# Patient Record
Sex: Female | Born: 1950 | Race: Black or African American | Hispanic: No | Marital: Single | Smoking: Current every day smoker
Health system: Southern US, Community
[De-identification: ages and names within clinical notes are randomized; demographics above are authoritative.]

## PROBLEM LIST (undated history)

## (undated) DIAGNOSIS — M810 Age-related osteoporosis without current pathological fracture: Secondary | ICD-10-CM

## (undated) HISTORY — PX: ABDOMINAL HYSTERECTOMY: SHX81

---

## 2007-04-16 ENCOUNTER — Ambulatory Visit: Payer: Self-pay | Admitting: Family Medicine

## 2008-01-27 ENCOUNTER — Emergency Department: Payer: Self-pay | Admitting: Emergency Medicine

## 2008-01-27 ENCOUNTER — Other Ambulatory Visit: Payer: Self-pay

## 2011-01-22 ENCOUNTER — Emergency Department: Payer: Self-pay | Admitting: Emergency Medicine

## 2015-10-25 ENCOUNTER — Encounter: Payer: Self-pay | Admitting: Emergency Medicine

## 2015-10-25 ENCOUNTER — Other Ambulatory Visit: Payer: Self-pay

## 2015-10-25 ENCOUNTER — Emergency Department: Payer: Commercial Managed Care - HMO

## 2015-10-25 ENCOUNTER — Emergency Department
Admission: EM | Admit: 2015-10-25 | Discharge: 2015-10-25 | Disposition: A | Discharge status: Home / Self Care | Payer: Commercial Managed Care - HMO | Attending: Emergency Medicine | Admitting: Emergency Medicine

## 2015-10-25 DIAGNOSIS — R197 Diarrhea, unspecified: Secondary | ICD-10-CM | POA: Diagnosis not present

## 2015-10-25 DIAGNOSIS — N3000 Acute cystitis without hematuria: Secondary | ICD-10-CM | POA: Diagnosis not present

## 2015-10-25 DIAGNOSIS — F17210 Nicotine dependence, cigarettes, uncomplicated: Secondary | ICD-10-CM | POA: Insufficient documentation

## 2015-10-25 DIAGNOSIS — R1110 Vomiting, unspecified: Secondary | ICD-10-CM

## 2015-10-25 DIAGNOSIS — R52 Pain, unspecified: Secondary | ICD-10-CM

## 2015-10-25 DIAGNOSIS — R1013 Epigastric pain: Secondary | ICD-10-CM | POA: Diagnosis present

## 2015-10-25 DIAGNOSIS — Z79899 Other long term (current) drug therapy: Secondary | ICD-10-CM | POA: Diagnosis not present

## 2015-10-25 LAB — URINALYSIS COMPLETE WITH MICROSCOPIC (ARMC ONLY)
BACTERIA UA: NONE SEEN
Bilirubin Urine: NEGATIVE
Glucose, UA: NEGATIVE mg/dL
Ketones, ur: NEGATIVE mg/dL
Nitrite: NEGATIVE
PROTEIN: NEGATIVE mg/dL
Specific Gravity, Urine: 1.019 (ref 1.005–1.030)
pH: 5 (ref 5.0–8.0)

## 2015-10-25 LAB — COMPREHENSIVE METABOLIC PANEL
ALBUMIN: 4.2 g/dL (ref 3.5–5.0)
ALK PHOS: 57 U/L (ref 38–126)
ALT: 17 U/L (ref 14–54)
ANION GAP: 7 (ref 5–15)
AST: 22 U/L (ref 15–41)
BUN: 6 mg/dL (ref 6–20)
CALCIUM: 10 mg/dL (ref 8.9–10.3)
CO2: 25 mmol/L (ref 22–32)
Chloride: 105 mmol/L (ref 101–111)
Creatinine, Ser: 0.64 mg/dL (ref 0.44–1.00)
GFR calc Af Amer: >60 mL/min (ref >60)
GFR calc non Af Amer: >60 mL/min (ref >60)
GLUCOSE: 89 mg/dL (ref 65–99)
POTASSIUM: 3.8 mmol/L (ref 3.5–5.1)
SODIUM: 137 mmol/L (ref 135–145)
Total Bilirubin: 0.5 mg/dL (ref 0.3–1.2)
Total Protein: 7 g/dL (ref 6.5–8.1)

## 2015-10-25 LAB — LIPASE, BLOOD: Lipase: 27 U/L (ref 11–51)

## 2015-10-25 LAB — CBC
HEMATOCRIT: 38.9 % (ref 35.0–47.0)
HEMOGLOBIN: 13.3 g/dL (ref 12.0–16.0)
MCH: 26.7 pg (ref 26.0–34.0)
MCHC: 34.2 g/dL (ref 32.0–36.0)
MCV: 78.1 fL — ABNORMAL LOW (ref 80.0–100.0)
Platelets: 290 10*3/uL (ref 150–440)
RBC: 4.99 MIL/uL (ref 3.80–5.20)
RDW: 12.7 % (ref 11.5–14.5)
WBC: 11.5 10*3/uL — ABNORMAL HIGH (ref 3.6–11.0)

## 2015-10-25 MED ORDER — CIPROFLOXACIN HCL 500 MG PO TABS
500.0000 mg | ORAL_TABLET | Freq: Once | ORAL | Status: AC
  Administered 2015-10-25: 500 mg via ORAL
  Filled 2015-10-25: qty 1

## 2015-10-25 MED ORDER — DIATRIZOATE MEGLUMINE & SODIUM 66-10 % PO SOLN
15.0000 mL | Freq: Once | ORAL | Status: AC
  Administered 2015-10-25: 15 mL via ORAL

## 2015-10-25 MED ORDER — ONDANSETRON HCL 4 MG/2ML IJ SOLN
4.0000 mg | Freq: Once | INTRAMUSCULAR | Status: AC
  Administered 2015-10-25: 4 mg via INTRAVENOUS

## 2015-10-25 MED ORDER — MORPHINE SULFATE (PF) 2 MG/ML IV SOLN
2.0000 mg | Freq: Once | INTRAVENOUS | Status: AC
  Administered 2015-10-25: 2 mg via INTRAVENOUS

## 2015-10-25 MED ORDER — CIPROFLOXACIN HCL 500 MG PO TABS: 500.0000 mg | ORAL_TABLET | Freq: Two times a day (BID) | ORAL | Status: AC

## 2015-10-25 MED ORDER — LOPERAMIDE HCL 2 MG PO CAPS
4.0000 mg | ORAL_CAPSULE | Freq: Once | ORAL | Status: AC
  Administered 2015-10-25: 4 mg via ORAL
  Filled 2015-10-25: qty 2

## 2015-10-25 MED ORDER — IOPAMIDOL (ISOVUE-300) INJECTION 61%
100.0000 mL | Freq: Once | INTRAVENOUS | Status: AC | PRN
  Administered 2015-10-25: 100 mL via INTRAVENOUS

## 2015-10-25 MED ORDER — ONDANSETRON HCL 4 MG/2ML IJ SOLN
INTRAMUSCULAR | Status: AC
  Administered 2015-10-25: 4 mg via INTRAVENOUS
  Filled 2015-10-25: qty 2

## 2015-10-25 MED ORDER — MORPHINE SULFATE (PF) 2 MG/ML IV SOLN
INTRAVENOUS | Status: AC
  Administered 2015-10-25: 2 mg via INTRAVENOUS
  Filled 2015-10-25: qty 1

## 2015-10-25 NOTE — ED Notes (Signed)
Pt back from Ultrasound

## 2015-10-25 NOTE — ED Notes (Signed)
Vanessa Mercado [phone] daughter

## 2015-10-25 NOTE — ED Notes (Signed)
Patient transported to Ultrasound 

## 2015-10-25 NOTE — ED Notes (Signed)
Discharge instructions reviewed with patient. Questions fielded by this RN. Patient verbalizes understanding of instructions. Patient discharged home in stable condition per Brown MD . No acute distress noted at time of discharge.   

## 2015-10-25 NOTE — Discharge Instructions (Signed)
Diarrhea °Diarrhea is frequent loose and watery bowel movements. It can cause you to feel weak and dehydrated. Dehydration can cause you to become tired and thirsty, have a dry mouth, and have decreased urination that often is dark yellow. Diarrhea is a sign of another problem, most often an infection that will not last long. In most cases, diarrhea typically lasts 2-3 days. However, it can last longer if it is a sign of something more serious. It is important to treat your diarrhea as directed by your caregiver to lessen or prevent future episodes of diarrhea. °CAUSES  °Some common causes include: °· Gastrointestinal infections caused by viruses, bacteria, or parasites. °· Food poisoning or food allergies. °· Certain medicines, such as antibiotics, chemotherapy, and laxatives. °· Artificial sweeteners and fructose. °· Digestive disorders. °HOME CARE INSTRUCTIONS °· Ensure adequate fluid intake (hydration): Have 1 cup (8 oz) of fluid for each diarrhea episode. Avoid fluids that contain simple sugars or sports drinks, fruit juices, whole milk products, and sodas. Your urine should be clear or pale yellow if you are drinking enough fluids. Hydrate with an oral rehydration solution that you can purchase at pharmacies, retail stores, and online. You can prepare an oral rehydration solution at home by mixing the following ingredients together: °·  - tsp table salt. °· ¾ tsp baking soda. °·  tsp salt substitute containing potassium chloride. °· 1  tablespoons sugar. °· 1 L (34 oz) of water. °· Certain foods and beverages may increase the speed at which food moves through the gastrointestinal (GI) tract. These foods and beverages should be avoided and include: °· Caffeinated and alcoholic beverages. °· High-fiber foods, such as raw fruits and vegetables, nuts, seeds, and whole grain breads and cereals. °· Foods and beverages sweetened with sugar alcohols, such as xylitol, sorbitol, and mannitol. °· Some foods may be well  tolerated and may help thicken stool including: °· Starchy foods, such as rice, toast, pasta, low-sugar cereal, oatmeal, grits, baked potatoes, crackers, and bagels. °· Bananas. °· Applesauce. °· Add probiotic-rich foods to help increase healthy bacteria in the GI tract, such as yogurt and fermented milk products. °· Wash your hands well after each diarrhea episode. °· Only take over-the-counter or prescription medicines as directed by your caregiver. °· Take a warm bath to relieve any burning or pain from frequent diarrhea episodes. °SEEK IMMEDIATE MEDICAL CARE IF:  °· You are unable to keep fluids down. °· You have persistent vomiting. °· You have blood in your stool, or your stools are black and tarry. °· You do not urinate in 6-8 hours, or there is only a small amount of very dark urine. °· You have abdominal pain that increases or localizes. °· You have weakness, dizziness, confusion, or light-headedness. °· You have a severe headache. °· Your diarrhea gets worse or does not get better. °· You have a fever or persistent symptoms for more than 2-3 days. °· You have a fever and your symptoms suddenly get worse. °MAKE SURE YOU:  °· Understand these instructions. °· Will watch your condition. °· Will get help right away if you are not doing well or get worse. °  °This information is not intended to replace advice given to you by your health care provider. Make sure you discuss any questions you have with your health care provider. °  °Document Released: 04/12/2002 Document Revised: 05/13/2014 Document Reviewed: 12/29/2011 °Elsevier Interactive Patient Education ©2016 [employer] ° °Urinary Tract Infection °Urinary tract infections (UTIs) can develop anywhere along   your urinary tract. Your urinary tract is your body's drainage system for removing wastes and extra water. Your urinary tract includes two kidneys, two ureters, a bladder, and a urethra. Your kidneys are a pair of bean-shaped organs. Each kidney is  about the size of your fist. They are located below your ribs, one on each side of your spine. °CAUSES °Infections are caused by microbes, which are microscopic organisms, including fungi, viruses, and bacteria. These organisms are so small that they can only be seen through a microscope. Bacteria are the microbes that most commonly cause UTIs. °SYMPTOMS  °Symptoms of UTIs may vary by age and gender of the patient and by the location of the infection. Symptoms in young women typically include a frequent and intense urge to urinate and a painful, burning feeling in the bladder or urethra during urination. Older women and men are more likely to be tired, shaky, and weak and have muscle aches and abdominal pain. A fever may mean the infection is in your kidneys. Other symptoms of a kidney infection include pain in your back or sides below the ribs, nausea, and vomiting. °DIAGNOSIS °To diagnose a UTI, your caregiver will ask you about your symptoms. Your caregiver will also ask you to provide a urine sample. The urine sample will be tested for bacteria and white blood cells. White blood cells are made by your body to help fight infection. °TREATMENT  °Typically, UTIs can be treated with medication. Because most UTIs are caused by a bacterial infection, they usually can be treated with the use of antibiotics. The choice of antibiotic and length of treatment depend on your symptoms and the type of bacteria causing your infection. °HOME CARE INSTRUCTIONS °· If you were prescribed antibiotics, take them exactly as your caregiver instructs you. Finish the medication even if you feel better after you have only taken some of the medication. °· Drink enough water and fluids to keep your urine clear or pale yellow. °· Avoid caffeine, tea, and carbonated beverages. They tend to irritate your bladder. °· Empty your bladder often. Avoid holding urine for long periods of time. °· Empty your bladder before and after sexual  intercourse. °· After a bowel movement, women should cleanse from front to back. Use each tissue only once. °SEEK MEDICAL CARE IF:  °· You have back pain. °· You develop a fever. °· Your symptoms do not begin to resolve within 3 days. °SEEK IMMEDIATE MEDICAL CARE IF:  °· You have severe back pain or lower abdominal pain. °· You develop chills. °· You have nausea or vomiting. °· You have continued burning or discomfort with urination. °MAKE SURE YOU:  °· Understand these instructions. °· Will watch your condition. °· Will get help right away if you are not doing well or get worse. °  °This information is not intended to replace advice given to you by your health care provider. Make sure you discuss any questions you have with your health care provider. °  °Document Released: 01/30/2005 Document Revised: 01/11/2015 Document Reviewed: 05/31/2011 °Elsevier Interactive Patient Education ©2016 [employer] ° °

## 2015-10-25 NOTE — ED Notes (Signed)
Patient transported to CT 

## 2015-10-25 NOTE — ED Notes (Signed)
Pt has been to toliet 3 times with diarrhea MD aware.

## 2015-10-25 NOTE — ED Notes (Signed)
Pt back from CT

## 2015-10-25 NOTE — ED Provider Notes (Signed)
[facility] Emergency Department Provider Note  ____________________________________________  Time seen: 2:00 AM  I have reviewed the triage vital signs and the nursing notes.   HISTORY  Chief Complaint Abdominal Pain and Vomiting      HPI Vanessa Mercado is a 65 y.o. female presents with epigastric/right upper quadrant abdominal pain times approximately one week with 1 episode of nonbloody vomiting today. Patient denies any fever afebrile on presentation temperature 97.7. Patient states that the pain is worse after eating.    Past medical history No pertinent past medical history There are no active problems to display for this patient.   Past Surgical History  Procedure Laterality Date  . Abdominal hysterectomy      Current Outpatient Rx  Name  Route  Sig  Dispense  Refill  . potassium gluconate 595 (99 K) MG TABS tablet   Oral   Take 595 mg by mouth daily.         . vitamin B-12 (CYANOCOBALAMIN) 1000 MCG tablet   Oral   Take 1,000 mcg by mouth daily.           Allergies No known drug allergies No family history on file.  Social History Social History  Substance Use Topics  . Smoking status: Current Every Day Smoker -- 0.50 packs/day for 0 years    Types: Cigarettes  . Smokeless tobacco: Never Used  . Alcohol Use: Yes    Review of Systems  Constitutional: Negative for fever. Eyes: Negative for visual changes. ENT: Negative for sore throat. Cardiovascular: Negative for chest pain. Respiratory: Negative for shortness of breath. Gastrointestinal: Positive for abdominal pain and vomiting Genitourinary: Negative for dysuria. Musculoskeletal: Negative for back pain. Skin: Negative for rash. Neurological: Negative for headaches, focal weakness or numbness.   10-point ROS otherwise negative.  ____________________________________________   PHYSICAL EXAM:  VITAL SIGNS: ED Triage Vitals  Enc Vitals Group     BP 10/25/15  0010 134/79 mmHg     Pulse Rate 10/25/15 0010 71     Resp 10/25/15 0010 18     Temp 10/25/15 0010 97.7 F (36.5 C)     Temp Source 10/25/15 0010 Oral     SpO2 10/25/15 0010 98 %     Weight 10/25/15 0010 135 lb (61.236 kg)     Height 10/25/15 0010 5\' 5"  (1.651 m)     Head Cir --      Peak Flow --      Pain Score 10/25/15 0010 7     Pain Loc --      Pain Edu? --      Excl. in GC? --     Constitutional: Alert and oriented. Well appearing and in no distress. Eyes: Conjunctivae are normal. PERRL. Normal extraocular movements. ENT   Head: Normocephalic and atraumatic.   Nose: No congestion/rhinnorhea.   Mouth/Throat: Mucous membranes are moist.   Neck: No stridor. Hematological/Lymphatic/Immunilogical: No cervical lymphadenopathy. Cardiovascular: Normal rate, regular rhythm. Normal and symmetric distal pulses are present in all extremities. No murmurs, rubs, or gallops. Respiratory: Normal respiratory effort without tachypnea nor retractions. Breath sounds are clear and equal bilaterally. No wheezes/rales/rhonchi. Gastrointestinal: Right upper quadrant/epigastric pain with palpation No distention. There is no CVA tenderness. Genitourinary: deferred Musculoskeletal: Nontender with normal range of motion in all extremities. No joint effusions.  No lower extremity tenderness nor edema. Neurologic:  Normal speech and language. No gross focal neurologic deficits are appreciated. Speech is normal.  Skin:  Skin is warm, dry  and intact. No rash noted. Psychiatric: Mood and affect are normal. Speech and behavior are normal. Patient exhibits appropriate insight and judgment.  ____________________________________________    LABS (pertinent positives/negatives)  Labs Reviewed  CBC - Abnormal; Notable for the following:    WBC 11.5 (*)    MCV 78.1 (*)    All other components within normal limits  URINALYSIS COMPLETEWITH MICROSCOPIC ([facility] ONLY) - Abnormal; Notable for the  following:    Color, Urine YELLOW (*)    APPearance CLEAR (*)    Hgb urine dipstick 2+ (*)    Leukocytes, UA 3+ (*)    Squamous Epithelial / LPF 0-5 (*)    All other components within normal limits  LIPASE, BLOOD  COMPREHENSIVE METABOLIC PANEL        RADIOLOGY  US Abdomen Limited RUQ (Final result) Result time: 10/25/15 03:07:51   Final result by Rad Results In Interface (10/25/15 03:07:51)   Narrative:   CLINICAL DATA: 65 year old female with abdominal pain and vomiting  EXAM: US ABDOMEN LIMITED - RIGHT UPPER QUADRANT  COMPARISON: None.  FINDINGS: Gallbladder:  No gallstones or wall thickening visualized. No sonographic Murphy sign noted by sonographer.  Common bile duct:  Diameter: 4 mm  Liver:  There is a patchy area increased echogenicity involving the right lobe of the liver which may represent fatty infiltration. The largest area measures 5.5 x 2.9 x 4.2 cm. Multiple hyperechoic lesion with no internal vascularity noted throughout the liver may represent hemangioma. These lesions measure up to 2.9 x 2.2 x 2.7 cm in the right lobe of the liver. Other etiologies including metastatic disease is not excluded. Further evaluation with MRI without and with contrast recommended.  IMPRESSION: No gallstone.  Patchy area of increased echogenicity within the right lobe of the liver possibly fatty infiltration. More focal echogenic lesions may represent hemangioma. Other etiologies including metastatic disease are not excluded. MRI without and with contrast recommended for further evaluation.   Electronically Signed By: Elgie CollardArash Radparvar M.D. On: 10/25/2015 03:07    CT Abdomen Pelvis W Contrast (Final result) Result time: 10/25/15 05:58:58   Final result by Rad Results In Interface (10/25/15 05:58:58)   Narrative:   CLINICAL DATA: 65 year old female with right upper quadrant, epigastric pain  EXAM: CT ABDOMEN AND PELVIS WITH  CONTRAST  TECHNIQUE: Multidetector CT imaging of the abdomen and pelvis was performed using the standard protocol following bolus administration of intravenous contrast.  CONTRAST: 100mL ISOVUE-300 IOPAMIDOL (ISOVUE-300) INJECTION 61%  COMPARISON: Right upper quadrant ultrasound dated 10/25/2015  FINDINGS: The visualized lung bases are clear. No intra-abdominal free air or free fluid.  There is apparent fatty infiltration of the liver. Multiple hepatic hypodense lesions noted measuring up to 3.4 x 5.3 cm in the right lobe of the liver posteriorly these lesions appear to demonstrate peripheral nodular enhancement with centripetal progression on more delayed images most compatible with hemangioma. Other etiologies are less likely but not excluded. Further evaluation with MRI without and with contrast is recommended. The gallbladder, pancreas, spleen, adrenal glands appear unremarkable. A subcentimeter left renal hypodense lesion is too small to characterize but most likely represents a cyst. The kidneys are otherwise unremarkable. The visualized ureters and urinary bladder appear unremarkable. Hysterectomy.  Loose stool noted within the colon compatible with diarrheal state. Correlation with clinical exam and stool cultures recommended. There is no evidence of bowel obstruction or active inflammation. Normal appendix.  There is mild aortoiliac atherosclerotic disease. The IVC appears unremarkable. No portal venous gas identified. There  is no adenopathy. The abdominal wall soft tissues appear unremarkable. There is mild degenerative changes of the spine. No acute fracture.  IMPRESSION: Diarrheal state. Correlation with clinical exam and stool cultures recommended. No bowel obstruction. Normal appendix.  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 Other etiologies are not excluded. Further evaluation with MRI without and with contrast on a non  emergent basis recommended for definitive characterization.   Electronically Signed By: Elgie Collard M.D.      INITIAL IMPRESSION / ASSESSMENT AND PLAN / ED COURSE  Pertinent labs & imaging results that were available during my care of the patient were reviewed by me and considered in my medical decision making (see chart for details).  Patient received Cipro and emergency department for urinary tract infection will be prescribed same at home. Regarding the patient's abdominal pain and diarrhea CT scan of the abdomen and pelvis consistent with a diarrheal state state however no other gross abnormality other than a possible hemangioma in the liver. Patient advised to follow-up with primary care provider  ____________________________________________   FINAL CLINICAL IMPRESSION(S) / ED DIAGNOSES  Final diagnoses:  Vomiting  Pain  Diarrhea, unspecified type  Acute cystitis without hematuria      Darci Current, MD 10/25/15 [phone]

## 2015-10-25 NOTE — ED Notes (Addendum)
Pt presents to ED with burning her upper abd for the past several days. Pt states she feels like it is acid reflux and states she is concerned because it is not improving after taking otc medications. Vomiting X1 today. Pain increases after eating.

## 2016-06-26 ENCOUNTER — Emergency Department
Admission: EM | Admit: 2016-06-26 | Discharge: 2016-06-26 | Disposition: A | Discharge status: Home / Self Care | Payer: Commercial Managed Care - HMO | Attending: Student in an Organized Health Care Education/Training Program | Admitting: Student in an Organized Health Care Education/Training Program

## 2016-06-26 ENCOUNTER — Encounter: Payer: Self-pay | Admitting: *Deleted

## 2016-06-26 DIAGNOSIS — F17210 Nicotine dependence, cigarettes, uncomplicated: Secondary | ICD-10-CM | POA: Insufficient documentation

## 2016-06-26 DIAGNOSIS — R252 Cramp and spasm: Secondary | ICD-10-CM | POA: Diagnosis present

## 2016-06-26 LAB — BASIC METABOLIC PANEL
ANION GAP: 6 (ref 5–15)
BUN: 6 mg/dL (ref 6–20)
CHLORIDE: 105 mmol/L (ref 101–111)
CO2: 27 mmol/L (ref 22–32)
Calcium: 10 mg/dL (ref 8.9–10.3)
Creatinine, Ser: 0.85 mg/dL (ref 0.44–1.00)
GFR calc Af Amer: >60 mL/min (ref >60)
GFR calc non Af Amer: >60 mL/min (ref >60)
GLUCOSE: 88 mg/dL (ref 65–99)
Potassium: 5.1 mmol/L (ref 3.5–5.1)
Sodium: 138 mmol/L (ref 135–145)

## 2016-06-26 LAB — CBC
HEMATOCRIT: 44.2 % (ref 35.0–47.0)
HEMOGLOBIN: 15 g/dL (ref 12.0–16.0)
MCH: 27 pg (ref 26.0–34.0)
MCHC: 33.9 g/dL (ref 32.0–36.0)
MCV: 79.5 fL — ABNORMAL LOW (ref 80.0–100.0)
Platelets: 308 10*3/uL (ref 150–440)
RBC: 5.56 MIL/uL — ABNORMAL HIGH (ref 3.80–5.20)
RDW: 13.7 % (ref 11.5–14.5)
WBC: 9.6 10*3/uL (ref 3.6–11.0)

## 2016-06-26 LAB — URINALYSIS, COMPLETE (UACMP) WITH MICROSCOPIC
BILIRUBIN URINE: NEGATIVE
Bacteria, UA: NONE SEEN
GLUCOSE, UA: NEGATIVE mg/dL
KETONES UR: NEGATIVE mg/dL
Nitrite: NEGATIVE
PROTEIN: NEGATIVE mg/dL
Specific Gravity, Urine: 1.014 (ref 1.005–1.030)
pH: 5 (ref 5.0–8.0)

## 2016-06-26 LAB — CK: Total CK: 147 U/L (ref 38–234)

## 2016-06-26 MED ORDER — B COMPLEX PO TABS: 1.0000 | ORAL_TABLET | Freq: Every day | ORAL | 0 refills | Status: AC

## 2016-06-26 MED ORDER — CYCLOBENZAPRINE HCL 10 MG PO TABS: 10.0000 mg | ORAL_TABLET | Freq: Every day | ORAL | 0 refills | Status: DC

## 2016-06-26 MED ORDER — DIAZEPAM 5 MG PO TABS
5.0000 mg | ORAL_TABLET | Freq: Once | ORAL | Status: AC
  Administered 2016-06-26: 5 mg via ORAL
  Filled 2016-06-26: qty 1

## 2016-06-26 MED ORDER — SODIUM CHLORIDE 0.9 % IV BOLUS (SEPSIS)
1000.0000 mL | Freq: Once | INTRAVENOUS | Status: AC
  Administered 2016-06-26: 1000 mL via INTRAVENOUS

## 2016-06-26 NOTE — ED Triage Notes (Signed)
Pt complains of cramping in bilateral feet,  Hands and legs, pt denies any other symptoms

## 2016-06-26 NOTE — ED Provider Notes (Signed)
[facility] Emergency Department Provider Note    None    (approximate)  I have reviewed the triage vital signs and the nursing notes.   HISTORY  Chief Complaint cramping of hands/feet/legs    HPI Vanessa Mercado is a 66 y.o. female is previously healthy presents with diffuse muscle cramps and worsening over the past 2-3 days. States she's never had this before. No recent fevers. No chest pain or shortness of breath. Denies any numbness or tingling. States his symptoms are worse at night and feels like a charley horse. States that she had several episodes in her hands as well as left leg yesterday while at work. EMS was called but patient refused transport to the ER. This morning while driving to work she felt her foot cramp while driving which scared her. She is able to get the cramp to abate but decided to come to the ER to be checked out. States that she has had history of low potassium and took potassium supplements starting yesterday but has not felt any significant change.   History reviewed. No pertinent past medical history. FMH:  No bleeding disorders Past Surgical History:  Procedure Laterality Date  . ABDOMINAL HYSTERECTOMY     There are no active problems to display for this patient.     Prior to Admission medications   Medication Sig Start Date End Date Taking? Authorizing Provider  potassium gluconate 595 (99 K) MG TABS tablet Take 595 mg by mouth daily.    Historical Provider, MD  vitamin B-12 (CYANOCOBALAMIN) 1000 MCG tablet Take 1,000 mcg by mouth daily.    Historical Provider, MD    Allergies Sulfa antibiotics    Social History Social History  Substance Use Topics  . Smoking status: Current Every Day Smoker    Packs/day: 0.50    Years: 0.00    Types: Cigarettes  . Smokeless tobacco: Never Used  . Alcohol use Yes    Review of Systems Patient denies headaches, rhinorrhea, blurry vision, numbness, shortness of breath, chest  pain, edema, cough, abdominal pain, nausea, vomiting, diarrhea, dysuria, fevers, rashes or hallucinations unless otherwise stated above in HPI. ____________________________________________   PHYSICAL EXAM:  VITAL SIGNS: Vitals:   06/26/16 0859  BP: (!) 115/94  Pulse: 92  Resp: 20  Temp: 98.7 F (37.1 C)    Constitutional: Alert and oriented. Well appearing and in no acute distress. Eyes: Conjunctivae are normal. PERRL. EOMI. Head: Atraumatic. Nose: No congestion/rhinnorhea. Mouth/Throat: Mucous membranes are moist.  Oropharynx non-erythematous. Neck: No stridor. Painless ROM. No cervical spine tenderness to palpation Hematological/Lymphatic/Immunilogical: No cervical lymphadenopathy. Cardiovascular: Normal rate, regular rhythm. Grossly normal heart sounds.  Good peripheral circulation. Respiratory: Normal respiratory effort.  No retractions. Lungs CTAB. Gastrointestinal: Soft and nontender. No distention. No abdominal bruits. No CVA tenderness. Genitourinary:  Musculoskeletal: No lower extremity tenderness nor edema.  No joint effusions. Neurologic:  Normal speech and language. No gross focal neurologic deficits are appreciated. No gait instability. Skin:  Skin is warm, dry and intact. No rash noted. Psychiatric: Mood and affect are normal. Speech and behavior are normal.  ____________________________________________   LABS (all labs ordered are listed, but only abnormal results are displayed)  No results found for this or any previous visit (from the past 24 hour(s)). ____________________________________________  EKG  My review and personal interpretation at Time: 9:47 Indication: chest pain  Rate: 75  Rhythm: sinus Axis: normal Other: non specific st changes, QT normal.  No acute ischemic changes  ____________________________________________  RADIOLOGY   ____________________________________________   PROCEDURES  Procedure(s) performed:   Procedures    Critical Care performed: no ____________________________________________   INITIAL IMPRESSION / ASSESSMENT AND PLAN / ED COURSE  Pertinent labs & imaging results that were available during my care of the patient were reviewed by me and considered in my medical decision making (see chart for details).  DDX: electrolyte abn, myositis, rhabdo, renal failure  Vanessa Mercado is a 66 y.o. who presents to the ED with diffuse muscle cramps as described above. Patient afebrile hemodynamically stable. Allegedly July showed no evidence of derangement. Patient had brief episode of nonradiating cramping pain under her left breast similar to other crampy discomfort. EKG obtained shows no evidence of acute ischemia. Patient with previous history of similar issues of muscle cramps. Possible component of dehydration. Provided IV fluids. I do feel patient is stable for close outpatient follow-up. We'll start on B complex vitamins to draw for efficacy.  Have discussed with the patient and available family all diagnostics and treatments performed thus far and all questions were answered to the best of my ability. The patient demonstrates understanding and agreement with plan.       ____________________________________________   FINAL CLINICAL IMPRESSION(S) / ED DIAGNOSES  Final diagnoses:  Muscle cramps      NEW MEDICATIONS STARTED DURING THIS VISIT:  New Prescriptions   No medications on file     Note:  This document was prepared using Dragon voice recognition software and may include unintentional dictation errors.    Willy Eddy, MD 06/26/16 1025

## 2016-06-26 NOTE — ED Notes (Signed)
Pt had an episode of sharp chest pain below L breast. Pt described it as a cramp. EKG completed, given to MD and made MD aware of episode

## 2016-06-26 NOTE — ED Notes (Signed)
IV removed from right arm by tech.

## 2016-12-12 ENCOUNTER — Emergency Department: Payer: Commercial Managed Care - HMO

## 2016-12-12 ENCOUNTER — Emergency Department
Admission: EM | Admit: 2016-12-12 | Discharge: 2016-12-12 | Disposition: A | Discharge status: Home / Self Care | Payer: Commercial Managed Care - HMO | Attending: Emergency Medicine | Admitting: Emergency Medicine

## 2016-12-12 ENCOUNTER — Encounter: Payer: Self-pay | Admitting: *Deleted

## 2016-12-12 DIAGNOSIS — N130 Hydronephrosis with ureteropelvic junction obstruction: Secondary | ICD-10-CM | POA: Insufficient documentation

## 2016-12-12 DIAGNOSIS — Z79899 Other long term (current) drug therapy: Secondary | ICD-10-CM | POA: Insufficient documentation

## 2016-12-12 DIAGNOSIS — F17210 Nicotine dependence, cigarettes, uncomplicated: Secondary | ICD-10-CM | POA: Insufficient documentation

## 2016-12-12 DIAGNOSIS — R109 Unspecified abdominal pain: Secondary | ICD-10-CM | POA: Diagnosis present

## 2016-12-12 DIAGNOSIS — N202 Calculus of kidney with calculus of ureter: Secondary | ICD-10-CM | POA: Insufficient documentation

## 2016-12-12 DIAGNOSIS — N200 Calculus of kidney: Secondary | ICD-10-CM

## 2016-12-12 LAB — CBC
HCT: 41.3 % (ref 35.0–47.0)
Hemoglobin: 13.7 g/dL (ref 12.0–16.0)
MCH: 26.6 pg (ref 26.0–34.0)
MCHC: 33.3 g/dL (ref 32.0–36.0)
MCV: 79.9 fL — ABNORMAL LOW (ref 80.0–100.0)
PLATELETS: 327 10*3/uL (ref 150–440)
RBC: 5.17 MIL/uL (ref 3.80–5.20)
RDW: 12.8 % (ref 11.5–14.5)
WBC: 11.9 10*3/uL — ABNORMAL HIGH (ref 3.6–11.0)

## 2016-12-12 LAB — URINALYSIS, COMPLETE (UACMP) WITH MICROSCOPIC
BACTERIA UA: NONE SEEN
BILIRUBIN URINE: NEGATIVE
GLUCOSE, UA: NEGATIVE mg/dL
KETONES UR: NEGATIVE mg/dL
Nitrite: NEGATIVE
PH: 5 (ref 5.0–8.0)
Protein, ur: NEGATIVE mg/dL
Specific Gravity, Urine: 1.016 (ref 1.005–1.030)

## 2016-12-12 LAB — BASIC METABOLIC PANEL
Anion gap: 8 (ref 5–15)
CALCIUM: 9.6 mg/dL (ref 8.9–10.3)
CO2: 25 mmol/L (ref 22–32)
CREATININE: 0.79 mg/dL (ref 0.44–1.00)
Chloride: 106 mmol/L (ref 101–111)
GFR calc non Af Amer: >60 mL/min (ref >60)
GLUCOSE: 113 mg/dL — AB (ref 65–99)
Potassium: 3.6 mmol/L (ref 3.5–5.1)
Sodium: 139 mmol/L (ref 135–145)

## 2016-12-12 MED ORDER — ONDANSETRON HCL 4 MG PO TABS: 4.0000 mg | ORAL_TABLET | Freq: Three times a day (TID) | ORAL | 0 refills | Status: DC | PRN

## 2016-12-12 MED ORDER — KETOROLAC TROMETHAMINE 30 MG/ML IJ SOLN
15.0000 mg | Freq: Once | INTRAMUSCULAR | Status: AC
  Administered 2016-12-12: 15 mg via INTRAVENOUS
  Filled 2016-12-12: qty 1

## 2016-12-12 MED ORDER — OXYCODONE-ACETAMINOPHEN 5-325 MG PO TABS
ORAL_TABLET | ORAL | Status: AC
  Administered 2016-12-12: 1 via ORAL
  Filled 2016-12-12: qty 1

## 2016-12-12 MED ORDER — ONDANSETRON HCL 4 MG/2ML IJ SOLN
4.0000 mg | Freq: Once | INTRAMUSCULAR | Status: AC
  Administered 2016-12-12: 4 mg via INTRAVENOUS
  Filled 2016-12-12: qty 2

## 2016-12-12 MED ORDER — IBUPROFEN 600 MG PO TABS: 600.0000 mg | ORAL_TABLET | Freq: Four times a day (QID) | ORAL | 0 refills | Status: DC | PRN

## 2016-12-12 MED ORDER — OXYCODONE-ACETAMINOPHEN 5-325 MG PO TABS: 1.0000 | ORAL_TABLET | Freq: Four times a day (QID) | ORAL | 0 refills | Status: AC | PRN

## 2016-12-12 MED ORDER — OXYCODONE-ACETAMINOPHEN 5-325 MG PO TABS
1.0000 | ORAL_TABLET | ORAL | Status: DC | PRN
  Administered 2016-12-12: 1 via ORAL

## 2016-12-12 MED ORDER — MORPHINE SULFATE (PF) 4 MG/ML IV SOLN
4.0000 mg | Freq: Once | INTRAVENOUS | Status: AC
  Administered 2016-12-12: 4 mg via INTRAVENOUS
  Filled 2016-12-12: qty 1

## 2016-12-12 NOTE — ED Provider Notes (Signed)
[facility] Emergency Department Provider Note  ____________________________________________  Time seen: Approximately 3:41 PM  I have reviewed the triage vital signs and the nursing notes.   HISTORY  Chief Complaint Flank Pain   HPI Vanessa Mercado is a 66 y.o. female with no significant past medical history who presents for evaluation of left flank pain. Patient reports sharp, severe, constant, left flank pain radiating to the left groin associated with decreased urine output has been ongoing for 2 days. She has had nausea but no vomiting. No fever or chills, no dysuria, no hematuria, no constipation or diarrhea. No chest pain or shortness of breath. No prior history of kidney stones. No prior abdominal surgeries. Pain is currently 10 out of 10.  History reviewed. No pertinent past medical history.  There are no active problems to display for this patient.   Past Surgical History:  Procedure Laterality Date  . ABDOMINAL HYSTERECTOMY      Prior to Admission medications   Medication Sig Start Date End Date Taking? Authorizing Provider  cyclobenzaprine (FLEXERIL) 10 MG tablet Take 1 tablet (10 mg total) by mouth at bedtime. 06/26/16   Willy Eddy, MD  ibuprofen (ADVIL,MOTRIN) 600 MG tablet Take 1 tablet (600 mg total) by mouth every 6 (six) hours as needed. 12/12/16   Nita Sickle, MD  ondansetron (ZOFRAN) 4 MG tablet Take 1 tablet (4 mg total) by mouth every 8 (eight) hours as needed for nausea or vomiting. 12/12/16   Don Perking, [place], MD  oxyCODONE-acetaminophen (ROXICET) 5-325 MG tablet Take 1 tablet by mouth every 6 (six) hours as needed. 12/12/16 12/12/17  Nita Sickle, MD    Allergies Sulfa antibiotics  History reviewed. No pertinent family history.  Social History Social History  Substance Use Topics  . Smoking status: Current Every Day Smoker    Packs/day: 0.50    Years: 0.00    Types: Cigarettes  . Smokeless tobacco: Never  Used  . Alcohol use Yes    Review of Systems  Constitutional: Negative for fever. Eyes: Negative for visual changes. ENT: Negative for sore throat. Neck: No neck pain  Cardiovascular: Negative for chest pain. Respiratory: Negative for shortness of breath. Gastrointestinal: Negative for abdominal pain, vomiting or diarrhea. + nausea Genitourinary: Negative for dysuria. + L flank pain Musculoskeletal: Negative for back pain. Skin: Negative for rash. Neurological: Negative for headaches, weakness or numbness. Psych: No SI or HI  ____________________________________________   PHYSICAL EXAM:  VITAL SIGNS: ED Triage Vitals  Enc Vitals Group     BP 12/12/16 1242 (!) 159/74     Pulse Rate 12/12/16 1242 60     Resp 12/12/16 1242 16     Temp 12/12/16 1242 97.9 F (36.6 C)     Temp Source 12/12/16 1242 Oral     SpO2 12/12/16 1242 99 %     Weight 12/12/16 1240 140 lb (63.5 kg)     Height 12/12/16 1240 5\' 5"  (1.651 m)     Head Circumference --      Peak Flow --      Pain Score 12/12/16 1240 10     Pain Loc --      Pain Edu? --      Excl. in GC? --     Constitutional: Alert and oriented, crying in significant distress due to pain HEENT:      Head: Normocephalic and atraumatic.         Eyes: Conjunctivae are normal. Sclera is non-icteric.  Mouth/Throat: Mucous membranes are moist.       Neck: Supple with no signs of meningismus. Cardiovascular: Regular rate and rhythm. No murmurs, gallops, or rubs. 2+ symmetrical distal pulses are present in all extremities. No JVD. Respiratory: Normal respiratory effort. Lungs are clear to auscultation bilaterally. No wheezes, crackles, or rhonchi.  Gastrointestinal: Soft, ttp over the LLQ, and non distended with positive bowel sounds. No rebound or guarding. Genitourinary: L CVA tenderness. Musculoskeletal: Nontender with normal range of motion in all extremities. No edema, cyanosis, or erythema of extremities. Neurologic: Normal speech  and language. Face is symmetric. Moving all extremities. No gross focal neurologic deficits are appreciated. Skin: Skin is warm, dry and intact. No rash noted. Psychiatric: Mood and affect are normal. Speech and behavior are normal.  ____________________________________________   LABS (all labs ordered are listed, but only abnormal results are displayed)  Labs Reviewed  URINALYSIS, COMPLETE (UACMP) WITH MICROSCOPIC - Abnormal; Notable for the following:       Result Value   Color, Urine AMBER (*)    APPearance TURBID (*)    Hgb urine dipstick LARGE (*)    Leukocytes, UA LARGE (*)    Squamous Epithelial / LPF 6-30 (*)    All other components within normal limits  BASIC METABOLIC PANEL - Abnormal; Notable for the following:    Glucose, Bld 113 (*)    BUN <5 (*)    All other components within normal limits  CBC - Abnormal; Notable for the following:    WBC 11.9 (*)    MCV 79.9 (*)    All other components within normal limits  URINE CULTURE   ____________________________________________  EKG  none ____________________________________________  RADIOLOGY  CT renal: Distal left ureteral stone causing obstructive change. A second tiny stone is also noted in the distal left ureter.  Nonobstructing left renal stone.  Multiple hepatic hemangiomas. ____________________________________________   PROCEDURES  Procedure(s) performed: None Procedures Critical Care performed:  None ____________________________________________   INITIAL IMPRESSION / ASSESSMENT AND PLAN / ED COURSE  66 y.o. female with no significant past medical history who presents for evaluation of left flank pain x 2 days Radiating to the left lower quadrant. Patient in significant distress due to the pain, normal vital signs, she has left flank tenderness and tenderness in the left lower quadrant. Urinalysis with large amount of hemoglobin concerning for kidney stone. Patient will be sent for CT renal  protocol. She'll be given Toradol, morphine, and Zofran for her symptoms.    _________________________ 5:13 PM on 12/12/2016 -----------------------------------------  CT showing 3 mm left UVJ stone with significant hydronephrosis. Patient's pain is well controlled. She is tolerating by mouth and requesting discharge. No evidence of overlying urinary tract infection or kidney dysfunction. Patient's chlamydia discharge home on Percocet, ibuprofen, Zofran, follow-up with urology. Recommend return to the emergency room if she develops dysuria, worsening abdominal pain, inability to tolerate by mouth, fever or chills.  Pertinent labs & imaging results that were available during my care of the patient were reviewed by me and considered in my medical decision making (see chart for details).    ____________________________________________   FINAL CLINICAL IMPRESSION(S) / ED DIAGNOSES  Final diagnoses:  Kidney stone      NEW MEDICATIONS STARTED DURING THIS VISIT:  New Prescriptions   IBUPROFEN (ADVIL,MOTRIN) 600 MG TABLET    Take 1 tablet (600 mg total) by mouth every 6 (six) hours as needed.   ONDANSETRON (ZOFRAN) 4 MG TABLET    Take 1  tablet (4 mg total) by mouth every 8 (eight) hours as needed for nausea or vomiting.   OXYCODONE-ACETAMINOPHEN (ROXICET) 5-325 MG TABLET    Take 1 tablet by mouth every 6 (six) hours as needed.     Note:  This document was prepared using Dragon voice recognition software and may include unintentional dictation errors.    Don Perking, [place], MD 12/12/16 [phone]

## 2016-12-12 NOTE — ED Triage Notes (Signed)
Pt states left flank pain that began last night, denies any vomiting, states it feels as if she has to urinate but cant, awake and alert, tearful

## 2016-12-12 NOTE — Discharge Instructions (Signed)

## 2016-12-14 LAB — URINE CULTURE

## 2017-07-10 ENCOUNTER — Emergency Department: Payer: Medicare HMO

## 2017-07-10 ENCOUNTER — Encounter: Payer: Self-pay | Admitting: Emergency Medicine

## 2017-07-10 ENCOUNTER — Other Ambulatory Visit: Payer: Self-pay

## 2017-07-10 ENCOUNTER — Emergency Department
Admission: EM | Admit: 2017-07-10 | Discharge: 2017-07-10 | Disposition: A | Discharge status: Home / Self Care | Payer: Medicare HMO | Attending: Emergency Medicine | Admitting: Emergency Medicine

## 2017-07-10 DIAGNOSIS — R450 Nervousness: Secondary | ICD-10-CM | POA: Diagnosis not present

## 2017-07-10 DIAGNOSIS — R42 Dizziness and giddiness: Secondary | ICD-10-CM | POA: Diagnosis not present

## 2017-07-10 DIAGNOSIS — K2900 Acute gastritis without bleeding: Secondary | ICD-10-CM | POA: Insufficient documentation

## 2017-07-10 DIAGNOSIS — R1013 Epigastric pain: Secondary | ICD-10-CM

## 2017-07-10 DIAGNOSIS — F17210 Nicotine dependence, cigarettes, uncomplicated: Secondary | ICD-10-CM | POA: Insufficient documentation

## 2017-07-10 LAB — HEPATIC FUNCTION PANEL
ALBUMIN: 4.6 g/dL (ref 3.5–5.0)
ALT: 14 U/L (ref 14–54)
AST: 19 U/L (ref 15–41)
Alkaline Phosphatase: 71 U/L (ref 38–126)
Bilirubin, Direct: <0.1 mg/dL — ABNORMAL LOW (ref 0.1–0.5)
TOTAL PROTEIN: 8.1 g/dL (ref 6.5–8.1)
Total Bilirubin: 0.9 mg/dL (ref 0.3–1.2)

## 2017-07-10 LAB — BASIC METABOLIC PANEL
Anion gap: 10 (ref 5–15)
BUN: 6 mg/dL (ref 6–20)
CALCIUM: 10 mg/dL (ref 8.9–10.3)
CHLORIDE: 105 mmol/L (ref 101–111)
CO2: 24 mmol/L (ref 22–32)
CREATININE: 0.78 mg/dL (ref 0.44–1.00)
Glucose, Bld: 91 mg/dL (ref 65–99)
Potassium: 4.3 mmol/L (ref 3.5–5.1)
SODIUM: 139 mmol/L (ref 135–145)

## 2017-07-10 LAB — CBC
HCT: 46.9 % (ref 35.0–47.0)
Hemoglobin: 15.3 g/dL (ref 12.0–16.0)
MCH: 26.3 pg (ref 26.0–34.0)
MCHC: 32.5 g/dL (ref 32.0–36.0)
MCV: 80.8 fL (ref 80.0–100.0)
Platelets: 357 10*3/uL (ref 150–440)
RBC: 5.81 MIL/uL — AB (ref 3.80–5.20)
RDW: 13.3 % (ref 11.5–14.5)
WBC: 12.8 10*3/uL — AB (ref 3.6–11.0)

## 2017-07-10 LAB — LIPASE, BLOOD: LIPASE: 47 U/L (ref 11–51)

## 2017-07-10 LAB — TROPONIN I

## 2017-07-10 MED ORDER — ALUM & MAG HYDROXIDE-SIMETH 200-200-20 MG/5ML PO SUSP: 30.0000 mL | ORAL | 0 refills | Status: AC | PRN

## 2017-07-10 MED ORDER — GI COCKTAIL ~~LOC~~
30.0000 mL | Freq: Once | ORAL | Status: AC
  Administered 2017-07-10: 30 mL via ORAL
  Filled 2017-07-10: qty 30

## 2017-07-10 MED ORDER — PANTOPRAZOLE SODIUM 40 MG PO TBEC
40.0000 mg | DELAYED_RELEASE_TABLET | Freq: Once | ORAL | Status: AC
  Administered 2017-07-10: 40 mg via ORAL
  Filled 2017-07-10: qty 1

## 2017-07-10 MED ORDER — PANTOPRAZOLE SODIUM 40 MG PO TBEC: 40.0000 mg | DELAYED_RELEASE_TABLET | Freq: Every day | ORAL | 1 refills | Status: DC

## 2017-07-10 MED ORDER — TRAZODONE HCL 50 MG PO TABS: 50.0000 mg | ORAL_TABLET | Freq: Every day | ORAL | 0 refills | Status: AC

## 2017-07-10 NOTE — ED Provider Notes (Signed)
[facility] Emergency Department Provider Note  Time seen: 4:58 PM  I have reviewed the triage vital signs and the nursing notes.   HISTORY  Chief Complaint Chest Pain    HPI Vanessa Mercado is a 67 y.o. female with a past medical history of gastric reflux who presents to the emergency department for epigastric discomfort and lightheadedness.  According to the patient for the past 1 week she has been experiencing intermittent epigastric burning.  States she has been taking Tums at home which has been relieving the discomfort but then it comes back several hours later.  Today she was at work when she became lightheaded.  States she became very jittery and shaky felt like she might pass out so she came to the emergency department for evaluation.  Patient denies any chest discomfort states mild epigastric burning currently.  Denies nausea vomiting or diaphoresis.  Does state mild shortness of breath and feels very "jittery."  Patient is moderately anxious throughout our examination, initially tearful per triage nurse, when asked why she was tearful she just says she is worried that something could be wrong.   History reviewed. No pertinent past medical history.  There are no active problems to display for this patient.   Past Surgical History:  Procedure Laterality Date  . ABDOMINAL HYSTERECTOMY      Prior to Admission medications   Medication Sig Start Date End Date Taking? Authorizing Provider  cyclobenzaprine (FLEXERIL) 10 MG tablet Take 1 tablet (10 mg total) by mouth at bedtime. 06/26/16   Willy Eddy, MD  ibuprofen (ADVIL,MOTRIN) 600 MG tablet Take 1 tablet (600 mg total) by mouth every 6 (six) hours as needed. 12/12/16   Nita Sickle, MD  ondansetron (ZOFRAN) 4 MG tablet Take 1 tablet (4 mg total) by mouth every 8 (eight) hours as needed for nausea or vomiting. 12/12/16   Don Perking, [place], MD  oxyCODONE-acetaminophen (ROXICET) 5-325 MG tablet Take 1  tablet by mouth every 6 (six) hours as needed. 12/12/16 12/12/17  Nita Sickle, MD    Allergies  Allergen Reactions  . Sulfa Antibiotics Rash    History reviewed. No pertinent family history.  Social History Social History   Tobacco Use  . Smoking status: Current Every Day Smoker    Packs/day: 0.50    Years: 0.00    Pack years: 0.00    Types: Cigarettes  . Smokeless tobacco: Never Used  Substance Use Topics  . Alcohol use: Yes  . Drug use: No    Review of Systems Constitutional: Negative for fever. Eyes: Negative for visual complaints ENT: Negative for recent illness/congestion Cardiovascular: Negative for chest pain. Respiratory: Mild shortness of breath when symptoms occurred earlier today, now resolved Gastrointestinal: Epigastric burning.  Negative for vomiting.  Patient states occasional loose stool over the past several days. Genitourinary: Negative for urinary compaints Musculoskeletal: Negative for musculoskeletal complaints Skin: Negative for skin complaints  Neurological: Negative for headache All other ROS negative  ____________________________________________   PHYSICAL EXAM:  VITAL SIGNS: ED Triage Vitals  Enc Vitals Group     BP 07/10/17 1636 (!) 117/96     Pulse Rate 07/10/17 1636 93     Resp 07/10/17 1636 18     Temp 07/10/17 1636 97.7 F (36.5 C)     Temp Source 07/10/17 1636 Oral     SpO2 07/10/17 1636 100 %     Weight 07/10/17 1637 136 lb (61.7 kg)     Height 07/10/17 1637 5\' 5"  (1.651  m)     Head Circumference --      Peak Flow --      Pain Score 07/10/17 1637 7     Pain Loc --      Pain Edu? --      Excl. in GC? --    Constitutional: Alert and oriented.  Moderately anxious appearing. Eyes: Normal exam ENT   Head: Normocephalic and atraumatic   Mouth/Throat: Mucous membranes are moist. Cardiovascular: Normal rate, regular rhythm. No murmur Respiratory: Normal respiratory effort without tachypnea nor retractions. Breath  sounds are clear  Gastrointestinal: Soft, moderate epigastric tenderness to palpation.  No rebound or guarding.  No distention.  Abdomen otherwise benign. Musculoskeletal: Nontender with normal range of motion in all extremities.  Neurologic:  Normal speech and language. No gross focal neurologic deficits  Skin:  Skin is warm, dry and intact.  Psychiatric: Mood and affect are normal.   ____________________________________________    EKG  EKG reviewed and interpreted by myself shows sinus rhythm at 91 bpm with a narrow QRS, normal axis, normal intervals, nonspecific but no concerning ST changes.  ____________________________________________    RADIOLOGY  Chest x-ray negative  ____________________________________________   INITIAL IMPRESSION / ASSESSMENT AND PLAN / ED COURSE  Pertinent labs & imaging results that were available during my care of the patient were reviewed by me and considered in my medical decision making (see chart for details).  Patient presents to the emergency department for epigastric burning and lightheadedness which occurred at work today.  Differential would include gastric reflux, gastritis, ACS, pneumonia, pneumothorax, gallbladder disease.  We will check labs including cardiac enzymes, hepatic function panel and lipase.  Will obtain an EKG as well as a chest x-ray.  Overall the patient appears well, fairly anxious appearing but otherwise normal exam, besides mild epigastric tenderness.  We will dose a GI cocktail and continue to monitor in the emergency department.  Patient's lab work has come back normal.  Patient states immediate improvement in symptoms after GI cocktail but feels like the burning is slowly starting to come back.  Patient has been using Maalox at home.  I discussed with the patient the need to follow-up with a GI doctor soon as possible.  Patient agreeable.  Also discussed starting the patient on Protonix.  She also states for the past week  or 2 she has been having extreme difficulty sleeping states she will get home late from her job and only sleep 2-3 hours in the night.  This could very likely be a cause of the patient's anxiety and stress throughout the day.  We will start the patient on a low-dose of trazodone to see if this helps with her symptoms.  Patient agreeable to this plan of care.  I discussed return precautions as well as GI follow-up.    ____________________________________________   FINAL CLINICAL IMPRESSION(S) / ED DIAGNOSES  Epigastric pain Lightheadedness    Minna AntisPaduchowski, Damani Rando, MD 07/10/17 1824

## 2017-07-10 NOTE — ED Triage Notes (Addendum)
Here for lower central chest pain intermittent X 1 week with SHOB.  Denies pain worse after eating.  Denies NVD. VSS. No medical hx.  Unlabored. Color WNL. Pt very anxious

## 2020-10-31 ENCOUNTER — Emergency Department
Admission: EM | Admit: 2020-10-31 | Discharge: 2020-10-31 | Disposition: A | Discharge status: Home / Self Care | Payer: Medicare HMO | Attending: Emergency Medicine | Admitting: Emergency Medicine

## 2020-10-31 ENCOUNTER — Other Ambulatory Visit: Payer: Self-pay

## 2020-10-31 ENCOUNTER — Emergency Department: Payer: Medicare HMO

## 2020-10-31 DIAGNOSIS — K209 Esophagitis, unspecified without bleeding: Secondary | ICD-10-CM | POA: Diagnosis not present

## 2020-10-31 DIAGNOSIS — F17210 Nicotine dependence, cigarettes, uncomplicated: Secondary | ICD-10-CM | POA: Diagnosis not present

## 2020-10-31 DIAGNOSIS — R109 Unspecified abdominal pain: Secondary | ICD-10-CM | POA: Insufficient documentation

## 2020-10-31 DIAGNOSIS — R072 Precordial pain: Secondary | ICD-10-CM | POA: Diagnosis present

## 2020-10-31 LAB — BASIC METABOLIC PANEL
Anion gap: 7 (ref 5–15)
BUN: 6 mg/dL — ABNORMAL LOW (ref 8–23)
CO2: 25 mmol/L (ref 22–32)
Calcium: 9.8 mg/dL (ref 8.9–10.3)
Chloride: 106 mmol/L (ref 98–111)
Creatinine, Ser: 0.59 mg/dL (ref 0.44–1.00)
GFR, Estimated: >60 mL/min (ref >60)
Glucose, Bld: 86 mg/dL (ref 70–99)
Potassium: 3.5 mmol/L (ref 3.5–5.1)
Sodium: 138 mmol/L (ref 135–145)

## 2020-10-31 LAB — HEPATIC FUNCTION PANEL
ALT: 15 U/L (ref 0–44)
AST: 22 U/L (ref 15–41)
Albumin: 4.1 g/dL (ref 3.5–5.0)
Alkaline Phosphatase: 51 U/L (ref 38–126)
Bilirubin, Direct: 0.3 mg/dL — ABNORMAL HIGH (ref 0.0–0.2)
Indirect Bilirubin: 0.4 mg/dL (ref 0.3–0.9)
Total Bilirubin: 0.7 mg/dL (ref 0.3–1.2)
Total Protein: 7.4 g/dL (ref 6.5–8.1)

## 2020-10-31 LAB — CBC
HCT: 45.4 % (ref 36.0–46.0)
Hemoglobin: 14.7 g/dL (ref 12.0–15.0)
MCH: 26.7 pg (ref 26.0–34.0)
MCHC: 32.4 g/dL (ref 30.0–36.0)
MCV: 82.5 fL (ref 80.0–100.0)
Platelets: 323 10*3/uL (ref 150–400)
RBC: 5.5 MIL/uL — ABNORMAL HIGH (ref 3.87–5.11)
RDW: 13.3 % (ref 11.5–15.5)
WBC: 9.8 10*3/uL (ref 4.0–10.5)
nRBC: 0 % (ref 0.0–0.2)

## 2020-10-31 LAB — LIPASE, BLOOD: Lipase: 32 U/L (ref 11–51)

## 2020-10-31 LAB — TROPONIN I (HIGH SENSITIVITY)
Troponin I (High Sensitivity): 3 ng/L (ref <18)
Troponin I (High Sensitivity): 3 ng/L (ref <18)

## 2020-10-31 MED ORDER — ALUM & MAG HYDROXIDE-SIMETH 200-200-20 MG/5ML PO SUSP: 30.0000 mL | Freq: Once | ORAL | Status: DC

## 2020-10-31 MED ORDER — PANTOPRAZOLE SODIUM 20 MG PO TBEC: 20.0000 mg | DELAYED_RELEASE_TABLET | Freq: Every day | ORAL | 1 refills | Status: DC

## 2020-10-31 MED ORDER — LORAZEPAM 2 MG/ML IJ SOLN
0.5000 mg | Freq: Once | INTRAMUSCULAR | Status: AC
  Administered 2020-10-31: 19:00:00 0.5 mg via INTRAVENOUS
  Filled 2020-10-31: qty 1

## 2020-10-31 MED ORDER — LIDOCAINE VISCOUS HCL 2 % MT SOLN: 15.0000 mL | Freq: Once | OROMUCOSAL | Status: DC

## 2020-10-31 MED ORDER — IOHEXOL 350 MG/ML SOLN
100.0000 mL | Freq: Once | INTRAVENOUS | Status: AC | PRN
  Administered 2020-10-31: 19:00:00 100 mL via INTRAVENOUS

## 2020-10-31 NOTE — Discharge Instructions (Addendum)
Follow-up with Dr. Tobi Bastos.  Please call for an appointment.  Take the Protonix as prescribed.  Return emergency department if worsening. Your CT of your chest, abdomen, and pelvis did not show an aortic dissection.

## 2020-10-31 NOTE — ED Triage Notes (Signed)
Pt comes with c/o substernal CP that radiates down to stomach. Pt states this started 3-4 days ago. Pt states she thought it was indigestion.  Pt states it has gotten worse. Pt states some SOB when pain comes.

## 2020-10-31 NOTE — ED Provider Notes (Signed)
[facility] Emergency Department Provider Note  ____________________________________________   Event Date/Time   First MD Initiated Contact with Patient 10/31/20 1818     (approximate)  I have reviewed the triage vital signs and the nursing notes.   HISTORY  Chief Complaint Chest Pain    HPI Vanessa Mercado is a 70 y.o. female presents emergency department complaining of substernal chest pain that is now radiating into the abdomen and back.  Patient states she took Pepto-Bismol and other over-the-counter medications without any relief.  Symptoms have been ongoing for 2 days.  States she has always been healthy and never been sick.  However she has recently lost 10 pounds over the past week as she has been unable to eat without pain she denies nausea or vomiting.  No diarrhea.  Denies dizziness  History reviewed. No pertinent past medical history.  There are no problems to display for this patient.   Past Surgical History:  Procedure Laterality Date   ABDOMINAL HYSTERECTOMY      Prior to Admission medications   Medication Sig Start Date End Date Taking? Authorizing Provider  pantoprazole (PROTONIX) 20 MG tablet Take 1 tablet (20 mg total) by mouth daily. 10/31/20 10/31/21 Yes Anibal Quinby, Roselyn Bering, PA-C  alum & mag hydroxide-simeth (MAALOX REGULAR STRENGTH) 200-200-20 MG/5ML suspension Take 30 mLs by mouth every 4 (four) hours as needed for indigestion or heartburn. 07/10/17   Minna Antis, MD  cyclobenzaprine (FLEXERIL) 10 MG tablet Take 1 tablet (10 mg total) by mouth at bedtime. 06/26/16   Willy Eddy, MD  ibuprofen (ADVIL,MOTRIN) 600 MG tablet Take 1 tablet (600 mg total) by mouth every 6 (six) hours as needed. 12/12/16   Nita Sickle, MD  ondansetron (ZOFRAN) 4 MG tablet Take 1 tablet (4 mg total) by mouth every 8 (eight) hours as needed for nausea or vomiting. 12/12/16   Don Perking, [place], MD  traZODone (DESYREL) 50 MG tablet Take 1 tablet (50 mg  total) by mouth at bedtime. 07/10/17   Minna Antis, MD    Allergies Sulfa antibiotics  No family history on file.  Social History Social History   Tobacco Use   Smoking status: Every Day    Packs/day: 0.50    Years: 0.00    Pack years: 0.00    Types: Cigarettes   Smokeless tobacco: Never  Substance Use Topics   Alcohol use: Yes   Drug use: No    Review of Systems  Constitutional: No fever/chills Eyes: No visual changes. ENT: No sore throat. Respiratory: Denies cough Cardiovascular: Positive chest pain Gastrointestinal: Positive abdominal pain Genitourinary: Negative for dysuria. Musculoskeletal: Negative for back pain. Skin: Negative for rash. Psychiatric: no mood changes,     ____________________________________________   PHYSICAL EXAM:  VITAL SIGNS: ED Triage Vitals  Enc Vitals Group     BP 10/31/20 1707 (!) 143/72     Pulse Rate 10/31/20 1707 76     Resp 10/31/20 1707 18     Temp 10/31/20 1707 98.4 F (36.9 C)     Temp Source 10/31/20 1707 Oral     SpO2 10/31/20 1707 99 %     Weight 10/31/20 1814 136 lb 0.4 oz (61.7 kg)     Height 10/31/20 1814 5\' 5"  (1.651 m)     Head Circumference --      Peak Flow --      Pain Score 10/31/20 1655 8     Pain Loc --      Pain Edu? --  Excl. in GC? --     Constitutional: Alert and oriented. Well appearing and in no acute distress.  Patient has a lot of anxiety at this time Eyes: Conjunctivae are normal.  Head: Atraumatic. Nose: No congestion/rhinnorhea. Mouth/Throat: Mucous membranes are moist.   Neck:  supple no lymphadenopathy noted Cardiovascular: Normal rate, regular rhythm. Heart sounds are normal, blood pressure is elevated Respiratory: Normal respiratory effort.  No retractions, lungs c t a  Abd: soft tender in the epigastric to upper mid abdomen, bs normal all 4 quad GU: deferred Musculoskeletal: FROM all extremities, warm and well perfused Neurologic:  Normal speech and language.  Skin:   Skin is warm, dry and intact. No rash noted. Psychiatric: Mood and affect are normal. Speech and behavior are normal.  ____________________________________________   LABS (all labs ordered are listed, but only abnormal results are displayed)  Labs Reviewed  BASIC METABOLIC PANEL - Abnormal; Notable for the following components:      Result Value   BUN 6 (*)    All other components within normal limits  CBC - Abnormal; Notable for the following components:   RBC 5.50 (*)    All other components within normal limits  HEPATIC FUNCTION PANEL - Abnormal; Notable for the following components:   Bilirubin, Direct 0.3 (*)    All other components within normal limits  LIPASE, BLOOD  TROPONIN I (HIGH SENSITIVITY)  TROPONIN I (HIGH SENSITIVITY)   ____________________________________________   ____________________________________________  RADIOLOGY  CTA chest abdomen pelvis for dissection  ____________________________________________   PROCEDURES  Procedure(s) performed: No  Procedures    ____________________________________________   INITIAL IMPRESSION / ASSESSMENT AND PLAN / ED COURSE  Pertinent labs & imaging results that were available during my care of the patient were reviewed by me and considered in my medical decision making (see chart for details).   Patient 70 year old female presents with substernal chest pain and abdominal pain.  See HPI.  Physical exam shows patient appears stable at this time  DDx: Dissecting aortic aneurysm, PUD, pancreatitis, MI  CBC is normal, basic metabolic panel is normal lipase is normal, troponin Is normal  CTA chest abdomen and pelvis for dissection reviewed by me confirmed by radiology to be negative for dissection  Did discuss the findings with the patient.  Feel this is more of an esophageal/GI problem.  She was given a prescription for Protonix.  Given a GI cocktail here in the ED.  She is to follow-up with GI by calling for an  appointment.  She states she understands.  She was discharged stable condition.  Vanessa Mercado was evaluated in Emergency Department on 10/31/2020 for the symptoms described in the history of present illness. She was evaluated in the context of the global COVID-19 pandemic, which necessitated consideration that the patient might be at risk for infection with the SARS-CoV-2 virus that causes COVID-19. Institutional protocols and algorithms that pertain to the evaluation of patients at risk for COVID-19 are in a state of rapid change based on information released by regulatory bodies including the CDC and federal and state organizations. These policies and algorithms were followed during the patient's care in the ED.    As part of my medical decision making, I reviewed the following data within the electronic MEDICAL RECORD NUMBER Nursing notes reviewed and incorporated, Labs reviewed , EKG interpreted NSR, Old chart reviewed, Radiograph reviewed , Notes from prior ED visits, and [place] Controlled Substance Database  ____________________________________________   FINAL CLINICAL IMPRESSION(S) / ED  DIAGNOSES  Final diagnoses:  Esophagitis      NEW MEDICATIONS STARTED DURING THIS VISIT:  New Prescriptions   PANTOPRAZOLE (PROTONIX) 20 MG TABLET    Take 1 tablet (20 mg total) by mouth daily.     Note:  This document was prepared using Dragon voice recognition software and may include unintentional dictation errors.    Faythe Ghee, PA-C 10/31/20 1952    Delton Prairie, MD 10/31/20 [phone]

## 2021-10-19 ENCOUNTER — Other Ambulatory Visit: Payer: Self-pay

## 2021-10-19 ENCOUNTER — Emergency Department: Payer: Medicare HMO

## 2021-10-19 ENCOUNTER — Encounter: Payer: Self-pay | Admitting: Intensive Care

## 2021-10-19 ENCOUNTER — Emergency Department
Admission: EM | Admit: 2021-10-19 | Discharge: 2021-10-19 | Disposition: A | Discharge status: Home / Self Care | Payer: Medicare HMO | Attending: Emergency Medicine | Admitting: Emergency Medicine

## 2021-10-19 DIAGNOSIS — Y92410 Unspecified street and highway as the place of occurrence of the external cause: Secondary | ICD-10-CM | POA: Insufficient documentation

## 2021-10-19 DIAGNOSIS — S40022A Contusion of left upper arm, initial encounter: Secondary | ICD-10-CM | POA: Insufficient documentation

## 2021-10-19 DIAGNOSIS — S199XXA Unspecified injury of neck, initial encounter: Secondary | ICD-10-CM | POA: Diagnosis present

## 2021-10-19 DIAGNOSIS — R519 Headache, unspecified: Secondary | ICD-10-CM | POA: Insufficient documentation

## 2021-10-19 DIAGNOSIS — S161XXA Strain of muscle, fascia and tendon at neck level, initial encounter: Secondary | ICD-10-CM | POA: Insufficient documentation

## 2021-10-19 HISTORY — DX: Age-related osteoporosis without current pathological fracture: M81.0

## 2021-10-19 MED ORDER — OXYCODONE-ACETAMINOPHEN 5-325 MG PO TABS
1.0000 | ORAL_TABLET | ORAL | Status: DC | PRN
  Filled 2021-10-19: qty 1

## 2021-10-19 MED ORDER — PREDNISONE 50 MG PO TABS: 50.0000 mg | ORAL_TABLET | Freq: Every day | ORAL | 0 refills | Status: DC

## 2021-10-19 MED ORDER — METHOCARBAMOL 500 MG PO TABS: 500.0000 mg | ORAL_TABLET | Freq: Four times a day (QID) | ORAL | 0 refills | Status: AC

## 2021-10-19 NOTE — ED Notes (Signed)
Patient declined discharge vital signs. 
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
## 2021-10-26 ENCOUNTER — Other Ambulatory Visit: Payer: Self-pay

## 2021-10-26 ENCOUNTER — Emergency Department: Payer: Medicare HMO

## 2021-10-26 ENCOUNTER — Encounter: Payer: Self-pay | Admitting: Emergency Medicine

## 2021-10-26 ENCOUNTER — Emergency Department
Admission: EM | Admit: 2021-10-26 | Discharge: 2021-10-26 | Disposition: A | Discharge status: Home / Self Care | Payer: Medicare HMO | Attending: Emergency Medicine | Admitting: Emergency Medicine

## 2021-10-26 DIAGNOSIS — R1013 Epigastric pain: Secondary | ICD-10-CM | POA: Diagnosis present

## 2021-10-26 DIAGNOSIS — R001 Bradycardia, unspecified: Secondary | ICD-10-CM | POA: Diagnosis not present

## 2021-10-26 DIAGNOSIS — I10 Essential (primary) hypertension: Secondary | ICD-10-CM | POA: Diagnosis not present

## 2021-10-26 DIAGNOSIS — K2900 Acute gastritis without bleeding: Secondary | ICD-10-CM | POA: Diagnosis not present

## 2021-10-26 LAB — COMPREHENSIVE METABOLIC PANEL
ALT: 18 U/L (ref 0–44)
AST: 18 U/L (ref 15–41)
Albumin: 4.1 g/dL (ref 3.5–5.0)
Alkaline Phosphatase: 51 U/L (ref 38–126)
Anion gap: 5 (ref 5–15)
BUN: <5 mg/dL — ABNORMAL LOW (ref 8–23)
CO2: 29 mmol/L (ref 22–32)
Calcium: 9.7 mg/dL (ref 8.9–10.3)
Chloride: 106 mmol/L (ref 98–111)
Creatinine, Ser: 0.68 mg/dL (ref 0.44–1.00)
GFR, Estimated: >60 mL/min (ref >60)
Glucose, Bld: 94 mg/dL (ref 70–99)
Potassium: 3.6 mmol/L (ref 3.5–5.1)
Sodium: 140 mmol/L (ref 135–145)
Total Bilirubin: 0.6 mg/dL (ref 0.3–1.2)
Total Protein: 7 g/dL (ref 6.5–8.1)

## 2021-10-26 LAB — URINALYSIS, ROUTINE W REFLEX MICROSCOPIC
Bacteria, UA: NONE SEEN
Bilirubin Urine: NEGATIVE
Glucose, UA: NEGATIVE mg/dL
Ketones, ur: NEGATIVE mg/dL
Leukocytes,Ua: NEGATIVE
Nitrite: NEGATIVE
Protein, ur: NEGATIVE mg/dL
Specific Gravity, Urine: 1.003 — ABNORMAL LOW (ref 1.005–1.030)
Squamous Epithelial / HPF: NONE SEEN (ref 0–5)
pH: 9 — ABNORMAL HIGH (ref 5.0–8.0)

## 2021-10-26 LAB — CBC
HCT: 44.7 % (ref 36.0–46.0)
Hemoglobin: 13.9 g/dL (ref 12.0–15.0)
MCH: 26.1 pg (ref 26.0–34.0)
MCHC: 31.1 g/dL (ref 30.0–36.0)
MCV: 84 fL (ref 80.0–100.0)
Platelets: 340 10*3/uL (ref 150–400)
RBC: 5.32 MIL/uL — ABNORMAL HIGH (ref 3.87–5.11)
RDW: 12.7 % (ref 11.5–15.5)
WBC: 10.5 10*3/uL (ref 4.0–10.5)
nRBC: 0 % (ref 0.0–0.2)

## 2021-10-26 LAB — TROPONIN I (HIGH SENSITIVITY): Troponin I (High Sensitivity): 4 ng/L (ref <18)

## 2021-10-26 LAB — LIPASE, BLOOD: Lipase: 24 U/L (ref 11–51)

## 2021-10-26 MED ORDER — ONDANSETRON 4 MG PO TBDP: 4.0000 mg | ORAL_TABLET | Freq: Three times a day (TID) | ORAL | 0 refills | Status: DC | PRN

## 2021-10-26 MED ORDER — LIDOCAINE VISCOUS HCL 2 % MT SOLN
15.0000 mL | Freq: Once | OROMUCOSAL | Status: AC
  Administered 2021-10-26: 15 mL via ORAL
  Filled 2021-10-26: qty 15

## 2021-10-26 MED ORDER — PANTOPRAZOLE SODIUM 40 MG PO TBEC: 40.0000 mg | DELAYED_RELEASE_TABLET | Freq: Every day | ORAL | 0 refills | Status: DC

## 2021-10-26 MED ORDER — PANTOPRAZOLE SODIUM 40 MG IV SOLR
40.0000 mg | Freq: Once | INTRAVENOUS | Status: AC
  Administered 2021-10-26: 40 mg via INTRAVENOUS
  Filled 2021-10-26: qty 10

## 2021-10-26 MED ORDER — SUCRALFATE 1 G PO TABS: 1.0000 g | ORAL_TABLET | Freq: Four times a day (QID) | ORAL | 0 refills | Status: DC

## 2021-10-26 MED ORDER — ONDANSETRON 4 MG PO TBDP: 4.0000 mg | ORAL_TABLET | Freq: Three times a day (TID) | ORAL | 0 refills | Status: AC | PRN

## 2021-10-26 MED ORDER — IOHEXOL 300 MG/ML  SOLN
100.0000 mL | Freq: Once | INTRAMUSCULAR | Status: AC | PRN
  Administered 2021-10-26: 100 mL via INTRAVENOUS

## 2021-10-26 MED ORDER — SUCRALFATE 1 G PO TABS
1.0000 g | ORAL_TABLET | Freq: Once | ORAL | Status: AC
  Administered 2021-10-26: 1 g via ORAL
  Filled 2021-10-26: qty 1

## 2021-10-26 MED ORDER — ACETAMINOPHEN 500 MG PO TABS
1000.0000 mg | ORAL_TABLET | Freq: Once | ORAL | Status: AC
  Administered 2021-10-26: 1000 mg via ORAL
  Filled 2021-10-26: qty 2

## 2021-10-26 MED ORDER — ALUM & MAG HYDROXIDE-SIMETH 200-200-20 MG/5ML PO SUSP
30.0000 mL | Freq: Once | ORAL | Status: AC
  Administered 2021-10-26: 30 mL via ORAL
  Filled 2021-10-26: qty 30

## 2021-10-26 MED ORDER — MORPHINE SULFATE (PF) 4 MG/ML IV SOLN
4.0000 mg | Freq: Once | INTRAVENOUS | Status: DC
  Filled 2021-10-26: qty 1
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
## 2023-05-20 ENCOUNTER — Emergency Department: Payer: Medicare HMO

## 2023-05-20 ENCOUNTER — Emergency Department
Admission: EM | Admit: 2023-05-20 | Discharge: 2023-05-20 | Disposition: A | Discharge status: Home / Self Care | Payer: Medicare HMO | Attending: Emergency Medicine | Admitting: Emergency Medicine

## 2023-05-20 ENCOUNTER — Encounter: Payer: Self-pay | Admitting: Emergency Medicine

## 2023-05-20 ENCOUNTER — Other Ambulatory Visit: Payer: Self-pay

## 2023-05-20 DIAGNOSIS — R531 Weakness: Secondary | ICD-10-CM | POA: Diagnosis not present

## 2023-05-20 DIAGNOSIS — R1013 Epigastric pain: Secondary | ICD-10-CM | POA: Insufficient documentation

## 2023-05-20 DIAGNOSIS — K2970 Gastritis, unspecified, without bleeding: Secondary | ICD-10-CM | POA: Diagnosis not present

## 2023-05-20 DIAGNOSIS — R001 Bradycardia, unspecified: Secondary | ICD-10-CM | POA: Diagnosis not present

## 2023-05-20 DIAGNOSIS — R932 Abnormal findings on diagnostic imaging of liver and biliary tract: Secondary | ICD-10-CM | POA: Diagnosis not present

## 2023-05-20 DIAGNOSIS — R160 Hepatomegaly, not elsewhere classified: Secondary | ICD-10-CM | POA: Diagnosis not present

## 2023-05-20 DIAGNOSIS — R110 Nausea: Secondary | ICD-10-CM | POA: Diagnosis not present

## 2023-05-20 DIAGNOSIS — K5730 Diverticulosis of large intestine without perforation or abscess without bleeding: Secondary | ICD-10-CM | POA: Diagnosis not present

## 2023-05-20 DIAGNOSIS — D1803 Hemangioma of intra-abdominal structures: Secondary | ICD-10-CM | POA: Diagnosis not present

## 2023-05-20 LAB — URINALYSIS, ROUTINE W REFLEX MICROSCOPIC
Bilirubin Urine: NEGATIVE
Glucose, UA: NEGATIVE mg/dL
Ketones, ur: NEGATIVE mg/dL
Nitrite: NEGATIVE
Protein, ur: NEGATIVE mg/dL
Specific Gravity, Urine: 1.013 (ref 1.005–1.030)
pH: 5 (ref 5.0–8.0)

## 2023-05-20 LAB — CBC
HCT: 42.7 % (ref 36.0–46.0)
Hemoglobin: 14.2 g/dL (ref 12.0–15.0)
MCH: 27.7 pg (ref 26.0–34.0)
MCHC: 33.3 g/dL (ref 30.0–36.0)
MCV: 83.2 fL (ref 80.0–100.0)
Platelets: 305 10*3/uL (ref 150–400)
RBC: 5.13 MIL/uL — ABNORMAL HIGH (ref 3.87–5.11)
RDW: 13 % (ref 11.5–15.5)
WBC: 9.8 10*3/uL (ref 4.0–10.5)
nRBC: 0 % (ref 0.0–0.2)

## 2023-05-20 LAB — COMPREHENSIVE METABOLIC PANEL
ALT: 12 U/L (ref 0–44)
AST: 15 U/L (ref 15–41)
Albumin: 3.8 g/dL (ref 3.5–5.0)
Alkaline Phosphatase: 44 U/L (ref 38–126)
Anion gap: 8 (ref 5–15)
BUN: <5 mg/dL — ABNORMAL LOW (ref 8–23)
CO2: 23 mmol/L (ref 22–32)
Calcium: 9.1 mg/dL (ref 8.9–10.3)
Chloride: 106 mmol/L (ref 98–111)
Creatinine, Ser: 0.68 mg/dL (ref 0.44–1.00)
GFR, Estimated: >60 mL/min (ref >60)
Glucose, Bld: 90 mg/dL (ref 70–99)
Potassium: 4 mmol/L (ref 3.5–5.1)
Sodium: 137 mmol/L (ref 135–145)
Total Bilirubin: 0.5 mg/dL (ref 0.0–1.2)
Total Protein: 6.8 g/dL (ref 6.5–8.1)

## 2023-05-20 LAB — TROPONIN I (HIGH SENSITIVITY)
Troponin I (High Sensitivity): 4 ng/L (ref <18)
Troponin I (High Sensitivity): 4 ng/L (ref <18)

## 2023-05-20 LAB — LIPASE, BLOOD: Lipase: 28 U/L (ref 11–51)

## 2023-05-20 MED ORDER — SUCRALFATE 1 G PO TABS: 1.0000 g | ORAL_TABLET | Freq: Four times a day (QID) | ORAL | 0 refills | Status: AC

## 2023-05-20 MED ORDER — PANTOPRAZOLE SODIUM 40 MG PO TBEC: 40.0000 mg | DELAYED_RELEASE_TABLET | Freq: Every day | ORAL | 0 refills | Status: DC

## 2023-05-20 MED ORDER — PANTOPRAZOLE SODIUM 40 MG PO TBEC: 40.0000 mg | DELAYED_RELEASE_TABLET | Freq: Every day | ORAL | 0 refills | Status: AC

## 2023-05-20 MED ORDER — PANTOPRAZOLE SODIUM 40 MG IV SOLR
40.0000 mg | Freq: Once | INTRAVENOUS | Status: AC
  Administered 2023-05-20: 40 mg via INTRAVENOUS
  Filled 2023-05-20: qty 10

## 2023-05-20 MED ORDER — ONDANSETRON HCL 4 MG/2ML IJ SOLN
4.0000 mg | Freq: Once | INTRAMUSCULAR | Status: AC
  Administered 2023-05-20: 4 mg via INTRAVENOUS
  Filled 2023-05-20: qty 2

## 2023-05-20 MED ORDER — SUCRALFATE 1 G PO TABS: 1.0000 g | ORAL_TABLET | Freq: Three times a day (TID) | ORAL | 0 refills | Status: DC

## 2023-05-20 MED ORDER — MORPHINE SULFATE (PF) 4 MG/ML IV SOLN
4.0000 mg | Freq: Once | INTRAVENOUS | Status: AC
  Administered 2023-05-20: 4 mg via INTRAVENOUS
  Filled 2023-05-20: qty 1

## 2023-05-20 MED ORDER — IOHEXOL 350 MG/ML SOLN
100.0000 mL | Freq: Once | INTRAVENOUS | Status: AC | PRN
  Administered 2023-05-20: 100 mL via INTRAVENOUS

## 2023-05-20 NOTE — ED Triage Notes (Signed)
 Pt here with abd pain x3 days that is all over. Pt states she feels weak. Pt is nauseous at this moment. Pt denies bleeding.

## 2023-05-20 NOTE — Discharge Instructions (Addendum)
 Please follow-up with GI outpatient but I suspect that this is most likely related to some gastritis or inflammation.  Do not take any ibuprofen , NSAIDs, BC powder, alcohol, smoke as these can all irritate the stomach.  If you need something for pain you can take Tylenol  1 g every 8 hours for 1 week.  Take Protonix  to decrease acid, Carafate  to help align the stomach and return to the ER for worsening symptoms or any other concerns

## 2023-05-20 NOTE — ED Provider Notes (Signed)
 [facility] Provider Note    Event Date/Time   First MD Initiated Contact with Patient 05/20/23 1328     (approximate)   History   Abdominal Pain   HPI  Vanessa Mercado is a 73 y.o. female with history of hysterectomy who comes in with epigastric pain.  Patient reports severe pain in her epigastric region radiating into her back.  She denies having this previously.  She states initially she thought it could be some gas and reflux but her pain was more severe therefore came in.  She denies any numbness, tingling.  She denies any falls, hitting her head.  Denies any shortness of breath.  Physical Exam   Triage Vital Signs: ED Triage Vitals  Encounter Vitals Group     BP 05/20/23 1004 (!) 156/81     Systolic BP Percentile --      Diastolic BP Percentile --      Pulse Rate 05/20/23 1004 (!) 57     Resp 05/20/23 1004 17     Temp 05/20/23 1004 97.9 F (36.6 C)     Temp Source 05/20/23 1004 Oral     SpO2 05/20/23 1004 100 %     Weight 05/20/23 1001 113 lb 1.5 oz (51.3 kg)     Height 05/20/23 1001 5' 7 (1.702 m)     Head Circumference --      Peak Flow --      Pain Score 05/20/23 1001 10     Pain Loc --      Pain Education --      Exclude from Growth Chart --     Most recent vital signs: Vitals:   05/20/23 1004 05/20/23 1059  BP: (!) 156/81 (!) 161/93  Pulse: (!) 57 60  Resp: 17 18  Temp: 97.9 F (36.6 C)   SpO2: 100% 100%     General: Awake, no distress.  CV:  Good peripheral perfusion.  Resp:  Normal effort.  Abd:  No distention.  Soft and nontender without any rebound, guarding Other:  Equal radial pulses.  Equal DP pulses   ED Results / Procedures / Treatments   Labs (all labs ordered are listed, but only abnormal results are displayed) Labs Reviewed  COMPREHENSIVE METABOLIC PANEL - Abnormal; Notable for the following components:      Result Value   BUN <5 (*)    All other components within normal limits  CBC - Abnormal; Notable  for the following components:   RBC 5.13 (*)    All other components within normal limits  LIPASE, BLOOD  URINALYSIS, ROUTINE W REFLEX MICROSCOPIC     EKG  My interpretation of EKG:  Sinus bradycardia rate of 56 without any ST elevation or T wave inversions, normal intervals  RADIOLOGY Pending    PROCEDURES:  Critical Care performed: No  Procedures   MEDICATIONS ORDERED IN ED: Medications  ondansetron  (ZOFRAN ) injection 4 mg (4 mg Intravenous Given 05/20/23 1121)  morphine  (PF) 4 MG/ML injection 4 mg (4 mg Intravenous Given 05/20/23 1302)     IMPRESSION / MDM / ASSESSMENT AND PLAN / ED COURSE  I reviewed the triage vital signs and the nursing notes.   Patient's presentation is most consistent with acute presentation with potential threat to life or bodily function.   Patient was given morphine , Zofran .  She has resolution of symptoms upon my evaluation but given the extent of discomfort and the pain radiating to her back CT imaging for dissection  ordered out front to further evaluate.  So the possibility of gallstones, ACS.  CBC is reassuring.  Lipase is normal CMP is reassuring  Troponin was negative and she reports that most of her pain was last night therefore this has been greater than 3 hours seems less likely to be cardiac in nature.  Did do a rectal exam no significant stool in the vault a little bit but I did get did look brown in nature.  She does report taking significant amounts of Maalox I suspect this is the cause for her dark stools given her hemoglobin is normal.  I have low suspicion for upper GI bleed.  Discussed with her that she was seen in the ER back in 2023 for gastritis.  She states that she is no longer on any of these medications.  If workup is negative will discharge her with Protonix , Carafate  GI follow-up.  Patient be handed off pending workup and possible discharge if reassuring.    FINAL CLINICAL IMPRESSION(S) / ED DIAGNOSES   Final  diagnoses:  Epigastric pain     Rx / DC Orders   ED Discharge Orders          Ordered    pantoprazole  (PROTONIX ) 40 MG tablet  Daily        05/20/23 1438    sucralfate  (CARAFATE ) 1 g tablet  3 times daily with meals & bedtime        05/20/23 1438             Note:  This document was prepared using Dragon voice recognition software and may include unintentional dictation errors.   Ernest Ronal BRAVO, MD 05/20/23 [phone]

## 2023-05-20 NOTE — ED Provider Notes (Signed)
-----------------------------------------   4:02 PM on 05/20/2023 ----------------------------------------- Patient care assumed from Dr. Ernest.  Patient is refusing ultrasound, states she has been here all day and she feels well denies any pain or discomfort.  Patient CT scan does not appear to show any significant acute abnormality.  Discussed with the patient her symptoms, could be suggestive more of gastritis.  However as the patient is pain-free and wishing to go home we will discharge home with Protonix  and supra fate have the patient follow-up with her doctor.  Provided my typical abdominal pain return precautions.  Patient agreeable to plan.   Dorothyann Drivers, MD 05/20/23 [phone]

## 2023-05-20 NOTE — ED Provider Triage Note (Signed)
 Emergency Medicine Provider Triage Evaluation Note  Vanessa Mercado , a 73 y.o. female  was evaluated in triage.  Pt complains of nausea,  vomiting and weakness.  Abd pain x 3 days.    Review of Systems  Positive: + nausea and vomiting Negative: No fever, chills  Physical Exam  BP (!) 161/93 (BP Location: Right Arm)   Pulse 60   Temp 97.9 F (36.6 C) (Oral)   Resp 18   Ht 5' 7 (1.702 m)   Wt 51.3 kg   SpO2 100%   BMI 17.71 kg/m  Gen:   Awake, no distress   Resp:  Normal effort  MSK:   Moves extremities without difficulty  Other:    Medical Decision Making  Medically screening exam initiated at 11:07 AM.  Appropriate orders placed.  Vanessa Mercado was informed that the remainder of the evaluation will be completed by another provider, this initial triage assessment does not replace that evaluation, and the importance of remaining in the ED until their evaluation is complete.     Saunders Shona CROME, PA-C 05/20/23 1109

## 2023-05-20 NOTE — ED Notes (Signed)
 Patient states hurting in middle of abdomen. Rates pain 9. States pain started 2 days ago. Pain med given per order.

## 2023-11-11 ENCOUNTER — Emergency Department

## 2023-11-11 ENCOUNTER — Emergency Department
Admission: EM | Admit: 2023-11-11 | Discharge: 2023-11-11 | Disposition: A | Discharge status: Home / Self Care | Attending: Emergency Medicine | Admitting: Emergency Medicine

## 2023-11-11 ENCOUNTER — Other Ambulatory Visit: Payer: Self-pay

## 2023-11-11 DIAGNOSIS — R1013 Epigastric pain: Secondary | ICD-10-CM | POA: Insufficient documentation

## 2023-11-11 DIAGNOSIS — I771 Stricture of artery: Secondary | ICD-10-CM | POA: Diagnosis not present

## 2023-11-11 DIAGNOSIS — R079 Chest pain, unspecified: Secondary | ICD-10-CM | POA: Diagnosis not present

## 2023-11-11 DIAGNOSIS — R1110 Vomiting, unspecified: Secondary | ICD-10-CM | POA: Diagnosis not present

## 2023-11-11 LAB — CBC WITH DIFFERENTIAL/PLATELET
Abs Immature Granulocytes: 0.04 K/uL (ref 0.00–0.07)
Basophils Absolute: 0.1 K/uL (ref 0.0–0.1)
Basophils Relative: 1 %
Eosinophils Absolute: 0.1 K/uL (ref 0.0–0.5)
Eosinophils Relative: 1 %
HCT: 39 % (ref 36.0–46.0)
Hemoglobin: 12.6 g/dL (ref 12.0–15.0)
Immature Granulocytes: 0 %
Lymphocytes Relative: 23 %
Lymphs Abs: 2.2 K/uL (ref 0.7–4.0)
MCH: 26.7 pg (ref 26.0–34.0)
MCHC: 32.3 g/dL (ref 30.0–36.0)
MCV: 82.6 fL (ref 80.0–100.0)
Monocytes Absolute: 0.9 K/uL (ref 0.1–1.0)
Monocytes Relative: 9 %
Neutro Abs: 6.2 K/uL (ref 1.7–7.7)
Neutrophils Relative %: 66 %
Platelets: 333 K/uL (ref 150–400)
RBC: 4.72 MIL/uL (ref 3.87–5.11)
RDW: 12.8 % (ref 11.5–15.5)
WBC: 9.5 K/uL (ref 4.0–10.5)
nRBC: 0 % (ref 0.0–0.2)

## 2023-11-11 LAB — COMPREHENSIVE METABOLIC PANEL WITH GFR
ALT: 17 U/L (ref 0–44)
AST: 21 U/L (ref 15–41)
Albumin: 3.5 g/dL (ref 3.5–5.0)
Alkaline Phosphatase: 46 U/L (ref 38–126)
Anion gap: 10 (ref 5–15)
BUN: 6 mg/dL — ABNORMAL LOW (ref 8–23)
CO2: 27 mmol/L (ref 22–32)
Calcium: 9.9 mg/dL (ref 8.9–10.3)
Chloride: 97 mmol/L — ABNORMAL LOW (ref 98–111)
Creatinine, Ser: 0.47 mg/dL (ref 0.44–1.00)
GFR, Estimated: >60 mL/min (ref >60)
Glucose, Bld: 127 mg/dL — ABNORMAL HIGH (ref 70–99)
Potassium: 2.8 mmol/L — ABNORMAL LOW (ref 3.5–5.1)
Sodium: 134 mmol/L — ABNORMAL LOW (ref 135–145)
Total Bilirubin: 0.6 mg/dL (ref 0.0–1.2)
Total Protein: 6.2 g/dL — ABNORMAL LOW (ref 6.5–8.1)

## 2023-11-11 LAB — TROPONIN I (HIGH SENSITIVITY): Troponin I (High Sensitivity): 3 ng/L (ref <18)

## 2023-11-11 LAB — URINALYSIS, ROUTINE W REFLEX MICROSCOPIC
Bilirubin Urine: NEGATIVE
Glucose, UA: NEGATIVE mg/dL
Ketones, ur: NEGATIVE mg/dL
Nitrite: NEGATIVE
Protein, ur: NEGATIVE mg/dL
Specific Gravity, Urine: 1.015 (ref 1.005–1.030)
pH: 5 (ref 5.0–8.0)

## 2023-11-11 LAB — TYPE AND SCREEN
ABO/RH(D): A POS
Antibody Screen: NEGATIVE

## 2023-11-11 LAB — LIPASE, BLOOD: Lipase: 43 U/L (ref 11–51)

## 2023-11-11 MED ORDER — MORPHINE SULFATE (PF) 4 MG/ML IV SOLN
4.0000 mg | Freq: Once | INTRAVENOUS | Status: AC
  Administered 2023-11-11: 4 mg via INTRAVENOUS
  Filled 2023-11-11: qty 1

## 2023-11-11 MED ORDER — SODIUM CHLORIDE 0.9 % IV BOLUS
1000.0000 mL | Freq: Once | INTRAVENOUS | Status: AC
  Administered 2023-11-11: 1000 mL via INTRAVENOUS

## 2023-11-11 MED ORDER — ONDANSETRON HCL 4 MG/2ML IJ SOLN
4.0000 mg | Freq: Once | INTRAMUSCULAR | Status: AC
  Administered 2023-11-11: 4 mg via INTRAVENOUS
  Filled 2023-11-11: qty 2

## 2023-11-11 MED ORDER — FAMOTIDINE IN NACL 20-0.9 MG/50ML-% IV SOLN
20.0000 mg | Freq: Once | INTRAVENOUS | Status: AC
  Administered 2023-11-11: 20 mg via INTRAVENOUS
  Filled 2023-11-11: qty 50

## 2023-11-11 MED ORDER — FAMOTIDINE 20 MG PO TABS: 20.0000 mg | ORAL_TABLET | Freq: Every day | ORAL | 1 refills | Status: DC

## 2023-11-11 MED ORDER — FAMOTIDINE 20 MG PO TABS: 20.0000 mg | ORAL_TABLET | Freq: Every day | ORAL | 1 refills | Status: AC

## 2023-11-11 NOTE — Discharge Instructions (Addendum)
 As we discussed, take the famotidine /Pepcid  once per day every day to help prevent these episodes and reduce acid production in the stomach.  I have also included information for GI/gastroenterology to be seen in the clinic  If your symptoms worsen then please return to the ED

## 2023-11-11 NOTE — ED Triage Notes (Addendum)
 Pt c/o sharp epigastric pain that radiates up into her chest and down into her abdomen. Pt endorses dark stools x2 days. Pt is not on blood thinners. Pt reports the pain became more intense today. Pt reports vomiting x2 yesterday

## 2023-11-11 NOTE — ED Provider Notes (Signed)
 [facility] Provider Note    Event Date/Time   First MD Initiated Contact with Patient 11/11/23 1343     (approximate)   History   Abdominal Pain   HPI  Vanessa Mercado is a 73 y.o. female who presents to the ED for evaluation of Abdominal Pain   I review ED visit from January of patient was seen for similar presentation of severe epigastric pain and emesis.  Had a CTA dissection study and RUQ ultrasound that were unrevealing.  Hemangiomas in the liver on CT imaging.  Patient presents for 3 days of epigastric pain and recurrent emesis.  Reports it feels worse intensity, similar quality as when she was here in January.  No stool or urinary changes.  Physical Exam   Triage Vital Signs: ED Triage Vitals  Encounter Vitals Group     BP 11/11/23 1321 128/81     Girls Systolic BP Percentile --      Girls Diastolic BP Percentile --      Boys Systolic BP Percentile --      Boys Diastolic BP Percentile --      Pulse Rate 11/11/23 1321 81     Resp 11/11/23 1321 (!) 22     Temp 11/11/23 1323 98.6 F (37 C)     Temp Source 11/11/23 1323 Oral     SpO2 11/11/23 1321 97 %     Weight 11/11/23 1320 125 lb (56.7 kg)     Height 11/11/23 1320 5' 7 (1.702 m)     Head Circumference --      Peak Flow --      Pain Score 11/11/23 1320 10     Pain Loc --      Pain Education --      Exclude from Growth Chart --     Most recent vital signs: Vitals:   11/11/23 1323 11/11/23 1400  BP:  (!) 144/80  Pulse:  63  Resp:  12  Temp: 98.6 F (37 C)   SpO2:  100%    General: Awake, no distress.  Hunched over and appears uncomfortable CV:  Good peripheral perfusion.  Resp:  Normal effort.  Abd:  No distention.  Minimal epigastric tenderness without peritoneal features or guarding.  Benign lower abdomen MSK:  No deformity noted.  Neuro:  No focal deficits appreciated. Other:     ED Results / Procedures / Treatments   Labs (all labs ordered are listed, but only  abnormal results are displayed) Labs Reviewed  COMPREHENSIVE METABOLIC PANEL WITH GFR - Abnormal; Notable for the following components:      Result Value   Sodium 134 (*)    Potassium 2.8 (*)    Chloride 97 (*)    Glucose, Bld 127 (*)    BUN 6 (*)    Total Protein 6.2 (*)    All other components within normal limits  URINALYSIS, ROUTINE W REFLEX MICROSCOPIC - Abnormal; Notable for the following components:   Color, Urine YELLOW (*)    APPearance HAZY (*)    Hgb urine dipstick MODERATE (*)    Leukocytes,Ua LARGE (*)    Bacteria, UA RARE (*)    All other components within normal limits  CBC WITH DIFFERENTIAL/PLATELET  LIPASE, BLOOD  TYPE AND SCREEN  TROPONIN I (HIGH SENSITIVITY)  TROPONIN I (HIGH SENSITIVITY)    EKG Sinus rhythm with a rate of 82 bpm.  Normal axis and intervals without clear signs of acute ischemia.  RADIOLOGY CXR  interpreted by me without evidence of acute cardiopulmonary pathology.  Official radiology report(s): DG Chest 2 View Result Date: 11/11/2023 CLINICAL DATA:  chest pain EXAM: CHEST - 2 VIEW COMPARISON:  May 20, 2023, October 31, 2020 FINDINGS: No focal airspace consolidation, pleural effusion, or pneumothorax. No cardiomegaly. Tortuous aorta. No acute fracture or destructive lesions. Multilevel thoracic osteophytosis. IMPRESSION: No acute cardiopulmonary abnormality. Electronically Signed   By: Rogelia Myers M.D.   On: 11/11/2023 13:50    PROCEDURES and INTERVENTIONS:  .1-3 Lead EKG Interpretation  Performed by: Claudene Rover, MD Authorized by: Claudene Rover, MD     Interpretation: normal     ECG rate:  66   ECG rate assessment: normal     Rhythm: sinus rhythm     Ectopy: none     Conduction: normal     Medications  famotidine  (PEPCID ) IVPB 20 mg premix (20 mg Intravenous New Bag/Given 11/11/23 1439)  ondansetron  (ZOFRAN ) injection 4 mg (4 mg Intravenous Given 11/11/23 1437)  sodium chloride  0.9 % bolus 1,000 mL (1,000 mLs Intravenous New  Bag/Given 11/11/23 1436)  morphine  (PF) 4 MG/ML injection 4 mg (4 mg Intravenous Given 11/11/23 1437)     IMPRESSION / MDM / ASSESSMENT AND PLAN / ED COURSE  I reviewed the triage vital signs and the nursing notes.  Differential diagnosis includes, but is not limited to, ACS, PTX, PNA, muscle strain/spasm, PE, dissection, anxiety, pleural effusion, GERD or gastritis  {Patient presents with symptoms of an acute illness or injury that is potentially life-threatening.  Presents with a couple days of epigastric pain and emesis of likely gastric etiology.  Nonischemic EKG, negative troponin and clear CXR.  Mild hypokalemia.  Normal lipase.  Feeling much better after IV fluids, H2 blocker, antiemetics.  She signed out to oncoming physician for reassessment with the anticipation of outpatient management with H2 blocker.  I have sent his prescription.  Referral information for GI  Clinical Course as of 11/11/23 1510  Tue Nov 11, 2023  1508 Reassessed.  Pain dramatically improved and she is feeling much better.  Discussed plan of care and she is agreeable. [DS]    Clinical Course User Index [DS] Claudene Rover, MD     FINAL CLINICAL IMPRESSION(S) / ED DIAGNOSES   Final diagnoses:  None     Rx / DC Orders   ED Discharge Orders     None        Note:  This document was prepared using Dragon voice recognition software and may include unintentional dictation errors.   Claudene Rover, MD 11/11/23 [phone]

## 2024-03-15 ENCOUNTER — Emergency Department
Admission: EM | Admit: 2024-03-15 | Discharge: 2024-03-15 | Disposition: A | Discharge status: Home / Self Care | Attending: Emergency Medicine | Admitting: Emergency Medicine

## 2024-03-15 ENCOUNTER — Emergency Department

## 2024-03-15 ENCOUNTER — Other Ambulatory Visit: Payer: Self-pay

## 2024-03-15 ENCOUNTER — Encounter: Payer: Self-pay | Admitting: *Deleted

## 2024-03-15 DIAGNOSIS — S199XXA Unspecified injury of neck, initial encounter: Secondary | ICD-10-CM | POA: Diagnosis not present

## 2024-03-15 DIAGNOSIS — M810 Age-related osteoporosis without current pathological fracture: Secondary | ICD-10-CM | POA: Diagnosis not present

## 2024-03-15 DIAGNOSIS — R0789 Other chest pain: Secondary | ICD-10-CM | POA: Insufficient documentation

## 2024-03-15 DIAGNOSIS — S0990XA Unspecified injury of head, initial encounter: Secondary | ICD-10-CM | POA: Diagnosis not present

## 2024-03-15 DIAGNOSIS — M542 Cervicalgia: Secondary | ICD-10-CM | POA: Insufficient documentation

## 2024-03-15 DIAGNOSIS — R001 Bradycardia, unspecified: Secondary | ICD-10-CM | POA: Diagnosis not present

## 2024-03-15 DIAGNOSIS — Y92410 Unspecified street and highway as the place of occurrence of the external cause: Secondary | ICD-10-CM | POA: Insufficient documentation

## 2024-03-15 DIAGNOSIS — Z041 Encounter for examination and observation following transport accident: Secondary | ICD-10-CM | POA: Diagnosis not present

## 2024-03-15 MED ORDER — HYDROCODONE-ACETAMINOPHEN 5-325 MG PO TABS
1.0000 | ORAL_TABLET | Freq: Once | ORAL | Status: AC
  Administered 2024-03-15: 1 via ORAL
  Filled 2024-03-15: qty 1

## 2024-03-15 MED ORDER — HYDROCODONE-ACETAMINOPHEN 5-325 MG PO TABS: 1.0000 | ORAL_TABLET | ORAL | 0 refills | Status: AC | PRN
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
## 2024-03-24 IMAGING — CT CT ABD-PELV W/ CM
2 of 5 series · 16 of 46 positions shown, 18 images · IV contrast (APPLIED)
Comparison: October 2020

CLINICAL DATA: Nausea/vomiting Abdominal pain, acute, nonlocalized

EXAM:
CT ABDOMEN AND PELVIS WITH CONTRAST
TECHNIQUE: Multidetector CT imaging of the abdomen and pelvis was performed
using the standard protocol following bolus administration of
intravenous contrast.

[Series 2: abdomen 5.0 · axial · 0.64mm/px · z∈[-912,-542]mm · 13 of 88 slices shown, 15 images]
[im 7/88  soft-tissue]
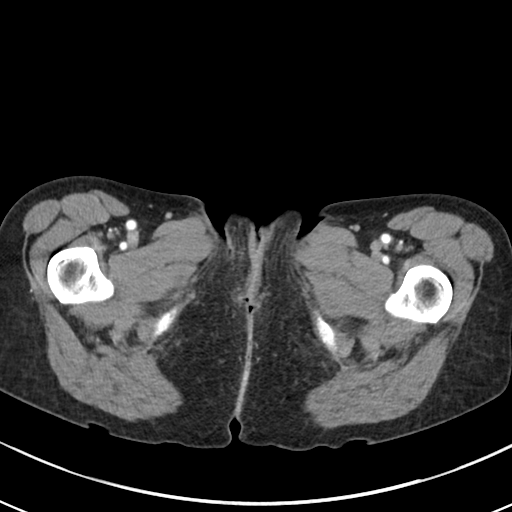
[im 7/88  bone]
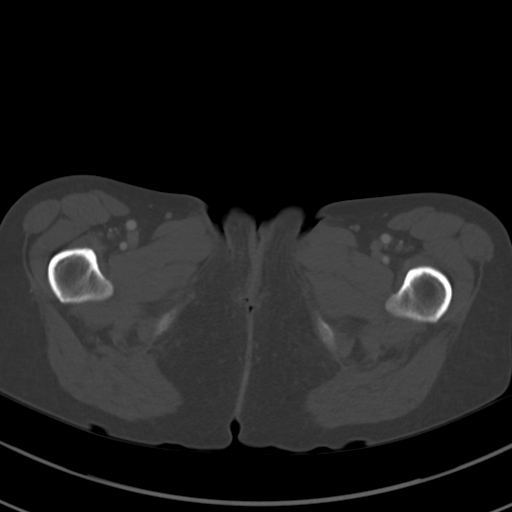
[im 13/88  soft-tissue]
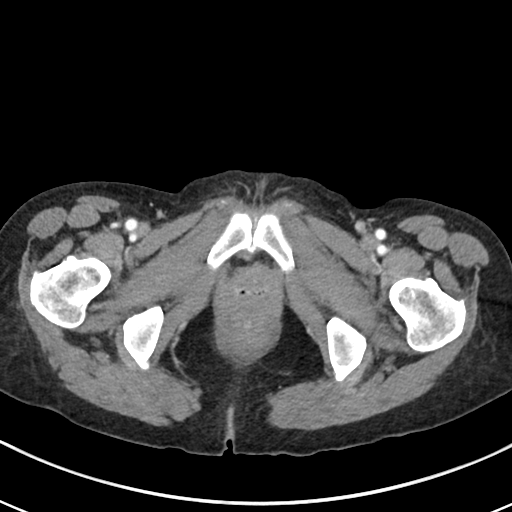
[im 19/88  soft-tissue]
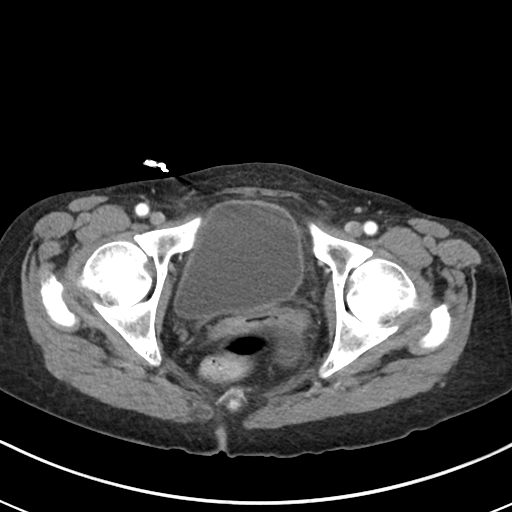
[im 25/88  soft-tissue]
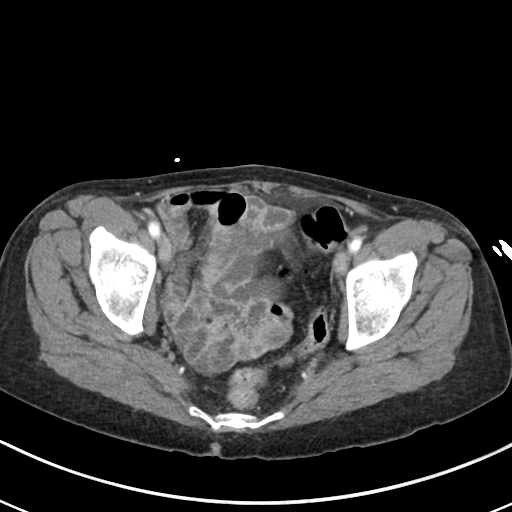
[im 32/88  soft-tissue]
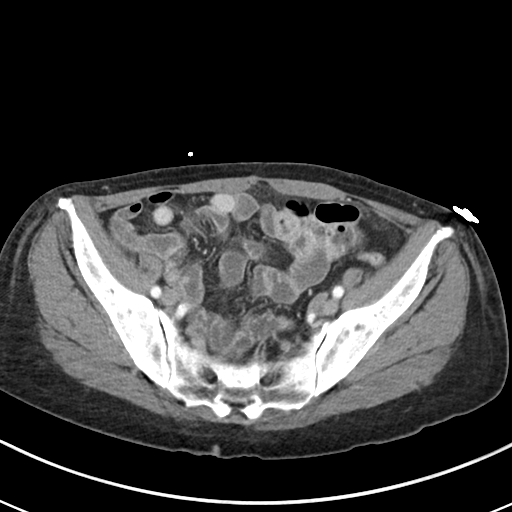
[im 38/88  soft-tissue]
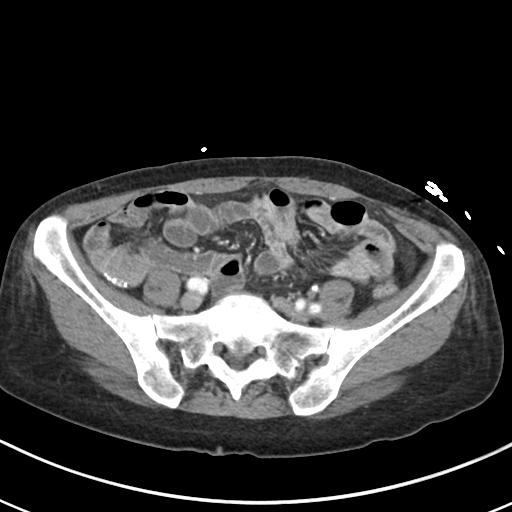
[im 44/88  soft-tissue]
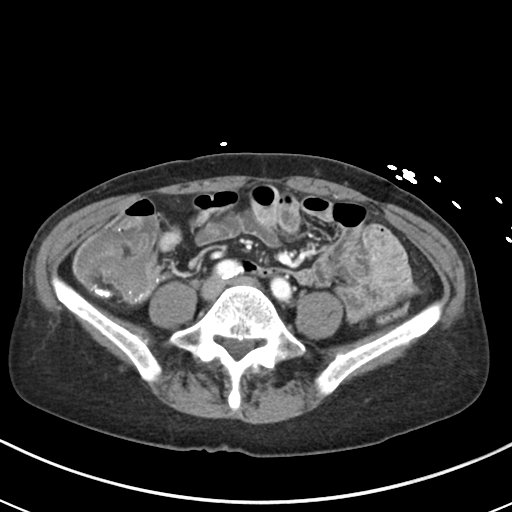
[im 50/88  soft-tissue]
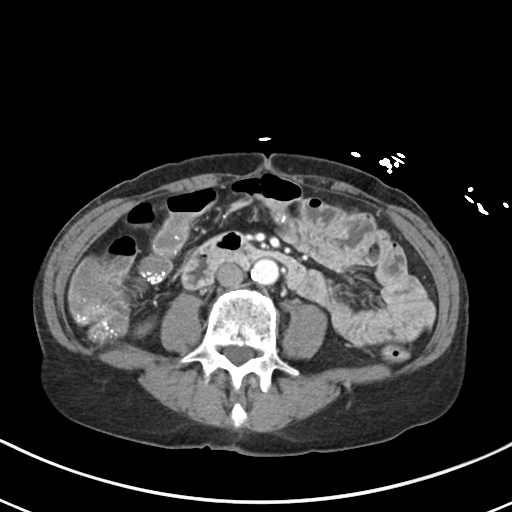
[im 56/88  soft-tissue]
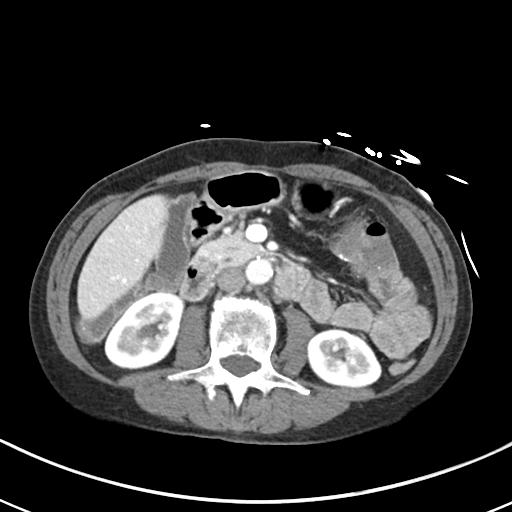
[im 56/88  bone]
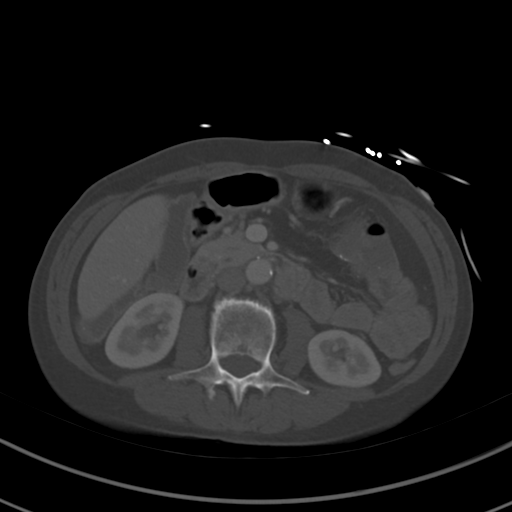
[im 63/88  soft-tissue]
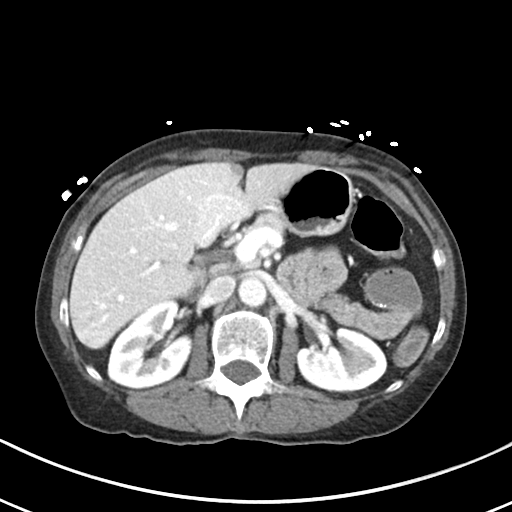
[im 69/88  soft-tissue]
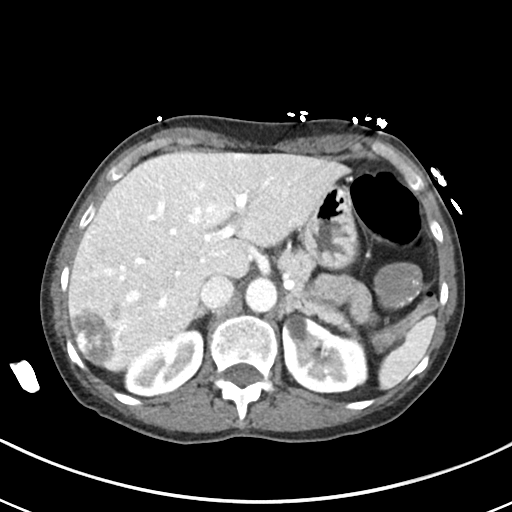
[im 75/88  soft-tissue]
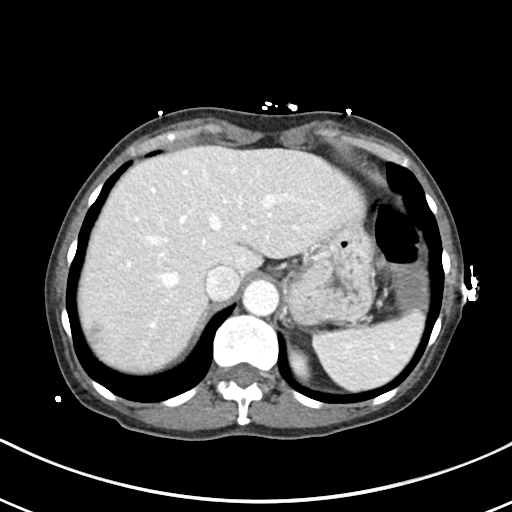
[im 81/88  soft-tissue]
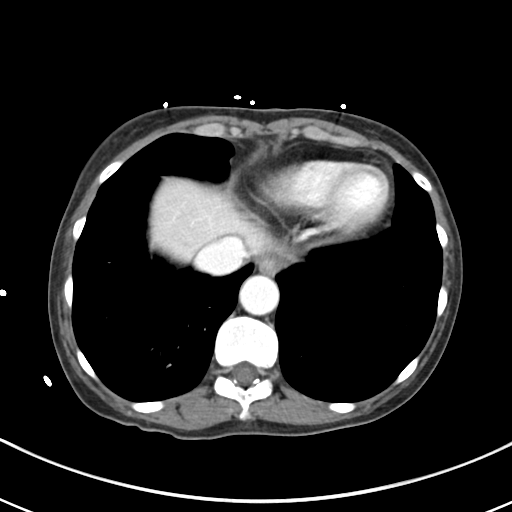

[Series 5: abdomen 3.0 mpr cor · coronal · 0.69mm/px · 3 of 79 slices shown]
[im 27/79  soft-tissue]
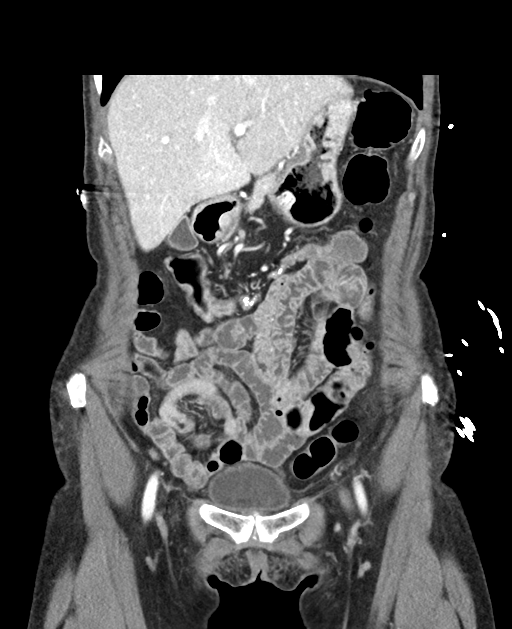
[im 35/79  soft-tissue]
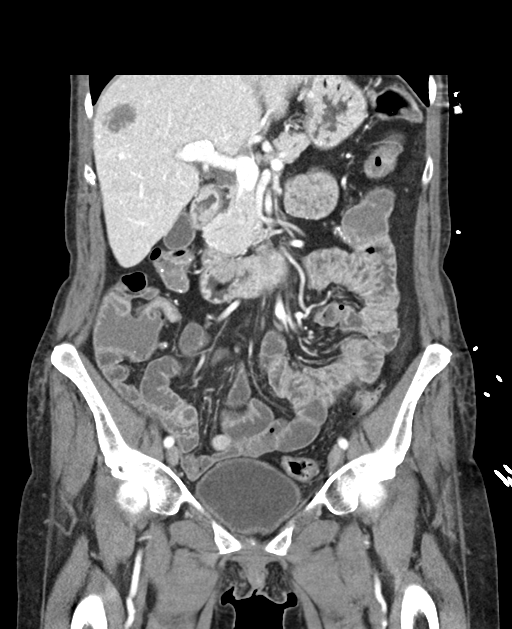
[im 44/79  soft-tissue]
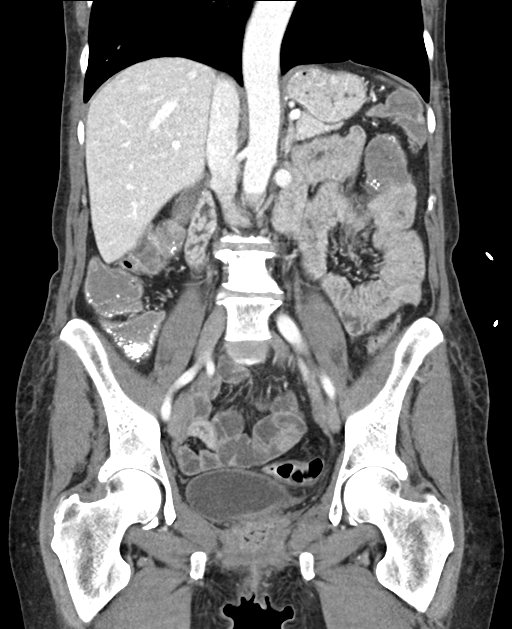

[16 of 46 positions shown; findings below may reference images not displayed]

RADIATION DOSE REDUCTION: This exam was performed according to the
departmental dose-optimization program which includes automated
exposure control, adjustment of the mA and/or kV according to
patient size and/or use of iterative reconstruction technique.

CONTRAST:  100mL OMNIPAQUE IOHEXOL 300 MG/ML  SOLN
FINDINGS: Lower chest: No acute abnormality.

Hepatobiliary: Hepatic hemangiomas are again identified. Gallbladder
is unremarkable. No biliary dilatation.

Pancreas: Unremarkable. No pancreatic ductal dilatation or
surrounding inflammatory changes.

Spleen: Unremarkable.

Adrenals/Urinary Tract: Adrenals are unremarkable. Small cyst of the
left kidney. Bladder is unremarkable.

Stomach/Bowel: Stomach is within normal limits. Bowel is normal in
caliber. Normal appendix. Mild sigmoid diverticulosis.

Vascular/Lymphatic: Calcified plaque.  No enlarged nodes.

Reproductive: Status post hysterectomy. No adnexal masses.

Other: No free fluid.  Abdominal wall is unremarkable.

Musculoskeletal: Lower lumbar degenerative changes. No acute osseous
abnormality.
IMPRESSION: No acute abnormality.

Stable chronic/nonemergent findings detailed above.
